# Patient Record
Sex: Male | Born: 1990 | Race: Black or African American | Hispanic: No | Marital: Single | Smoking: Never smoker
Health system: Southern US, Community
[De-identification: ages and names within clinical notes are randomized; demographics above are authoritative.]

---

## 2011-12-31 ENCOUNTER — Ambulatory Visit (INDEPENDENT_AMBULATORY_CARE_PROVIDER_SITE_OTHER): Payer: 59 | Admitting: Family Medicine

## 2011-12-31 VITALS — BP 124/77 | HR 81 | Temp 98.4°F | Resp 16 | Ht 67.25 in | Wt 185.2 lb

## 2011-12-31 DIAGNOSIS — Z0289 Encounter for other administrative examinations: Secondary | ICD-10-CM

## 2011-12-31 NOTE — Progress Notes (Signed)
  Subjective:    Patient ID: Bryan Williamson, male    DOB: [birth year], 21 y.o.   MRN: [id number]  HPI 21 yo male here for sports physical. Will be doing soccer and basketball at [employer].  Has form but did not bring it.  Using [employer] [employer] form for now.  He will bring his back in and we can fill it out if needed.  Screening questionnaire negative except sickle cell trait. Otherwise no concerns.   Review of Systems Negative except as per HPI     Objective:   Physical Exam  Constitutional: Vital signs are normal. He appears well-developed and well-nourished. He is active.  HENT:  Head: Normocephalic.  Right Ear: Hearing, tympanic membrane, external ear and ear canal normal.  Left Ear: Hearing, tympanic membrane, external ear and ear canal normal.  Nose: Nose normal.  Mouth/Throat: Uvula is midline, oropharynx is clear and moist and mucous membranes are normal.  Eyes: Conjunctivae and EOM are normal. Pupils are equal, round, and reactive to light.  Neck: No mass and no thyromegaly present.  Cardiovascular: Normal rate, regular rhythm, normal heart sounds, intact distal pulses and normal pulses.   Pulmonary/Chest: Effort normal and breath sounds normal.  Abdominal: Soft. Normal appearance and bowel sounds are normal. There is no hepatosplenomegaly. There is no tenderness. There is no CVA tenderness. No hernia. Hernia confirmed negative in the right inguinal area and confirmed negative in the left inguinal area.  Genitourinary: Testes normal and penis normal.  Lymphadenopathy:    He has no cervical adenopathy.    He has no axillary adenopathy.  Neurological: He is alert.          Assessment & Plan:  Normal exam.  Okay to play.

## 2012-01-01 ENCOUNTER — Telehealth: Payer: Self-pay

## 2012-01-01 NOTE — Telephone Encounter (Signed)
Dr. Georgiana Shore performed CPE.  Will forward note to her.

## 2012-01-01 NOTE — Telephone Encounter (Signed)
PT HAD A PE DONE BY DR.RICHTER YESTERDAY BUT NEEDS A FORM FILLED OUT FOR [employer] BASED ON THAT PE. PLEASE CALL WHEN READY: [phone] FORM IN [name]'S BOX

## 2012-01-01 NOTE — Telephone Encounter (Signed)
I am out of town until the 29th.  It was a normal exam.  Please complete the form based on my documented exam.

## 2012-01-02 NOTE — Telephone Encounter (Signed)
Called pt advised forms ready to pick up

## 2012-02-14 ENCOUNTER — Ambulatory Visit (INDEPENDENT_AMBULATORY_CARE_PROVIDER_SITE_OTHER): Payer: 59 | Admitting: Family Medicine

## 2012-02-14 ENCOUNTER — Ambulatory Visit: Payer: 59

## 2012-02-14 VITALS — BP 128/85 | HR 59 | Temp 98.0°F | Resp 16 | Ht 67.0 in | Wt 186.8 lb

## 2012-02-14 DIAGNOSIS — R109 Unspecified abdominal pain: Secondary | ICD-10-CM

## 2012-02-14 DIAGNOSIS — K5900 Constipation, unspecified: Secondary | ICD-10-CM

## 2012-02-14 DIAGNOSIS — R1084 Generalized abdominal pain: Secondary | ICD-10-CM

## 2012-02-14 LAB — POCT CBC
Granulocyte percent: 28.5 %G — AB (ref 37–80)
HCT, POC: 48 % (ref 43.5–53.7)
Hemoglobin: 16.5 g/dL (ref 14.1–18.1)
Lymph, poc: 2.8 (ref 0.6–3.4)
MCH, POC: 29.7 pg (ref 27–31.2)
MCHC: 34.4 g/dL (ref 31.8–35.4)
MCV: 86.5 fL (ref 80–97)
MID (cbc): 0.3 (ref 0–0.9)
MPV: 8.9 fL (ref 0–99.8)
POC Granulocyte: 1.3 — AB (ref 2–6.9)
POC LYMPH PERCENT: 64.2 %L — AB (ref 10–50)
POC MID %: 7.3 %M (ref 0–12)
Platelet Count, POC: 276 10*3/uL (ref 142–424)
RBC: 5.55 M/uL (ref 4.69–6.13)
RDW, POC: 14.3 %
WBC: 4.4 10*3/uL — AB (ref 4.6–10.2)

## 2012-02-14 LAB — POCT URINALYSIS DIPSTICK
Bilirubin, UA: NEGATIVE
Glucose, UA: NEGATIVE
Ketones, UA: NEGATIVE
Leukocytes, UA: NEGATIVE
Nitrite, UA: NEGATIVE
Protein, UA: NEGATIVE
Spec Grav, UA: 1.02
Urobilinogen, UA: 0.2
pH, UA: 7.5

## 2012-02-14 LAB — POCT UA - MICROSCOPIC ONLY
Casts, Ur, LPF, POC: NEGATIVE
Crystals, Ur, HPF, POC: NEGATIVE
Epithelial cells, urine per micros: NEGATIVE
Mucus, UA: POSITIVE
Yeast, UA: NEGATIVE

## 2012-02-14 MED ORDER — POLYETHYLENE GLYCOL 3350 17 GM/SCOOP PO POWD: 17.0000 g | Freq: Two times a day (BID) | ORAL | Status: AC | PRN

## 2012-02-14 NOTE — Progress Notes (Signed)
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 He has one week of right upper quadrant pain which is intermittent and worsened by eating. The pain starts in the right quadrant and radiates around the back. He's had no nausea vomiting, no fever, no urinary symptoms. He's had no past medical history of abdominal pain like this.  Family history is negative for diabetes, hypertension, abdominal problems  Patient has noted constipation lately and thinks that this pain may be related to that.  Objective: No acute distress HEENT: Mild gingivitis otherwise negative Neck: Supple no adenopathy Chest: Clear Heart: Regular, no murmur or gallop Abdomen: Soft without guarding or rebound. He does have some mild tenderness and fullness in the right lower quadrant. Skin: No rash Extremities: Well developed, muscular man with no rashes, good pedal pulses [facility] reading (PRIMARY) by  Dr. Milus Glazier. Large amount of stool in rectal ampulla  Results for orders placed in visit on 02/14/12  POCT CBC      Component Value Range   WBC 4.4 (*) 4.6 - 10.2 (K/uL)   Lymph, poc 2.8  0.6 - 3.4    POC LYMPH PERCENT 64.2 (*) 10 - 50 (%L)   MID (cbc) 0.3  0 - 0.9    POC MID % 7.3  0 - 12 (%M)   POC Granulocyte 1.3 (*) 2 - 6.9    Granulocyte percent 28.5 (*) 37 - 80 (%G)   RBC 5.55  4.69 - 6.13 (M/uL)   Hemoglobin 16.5  14.1 - 18.1 (g/dL)   HCT, POC 16.1  09.6 - 53.7 (%)   MCV 86.5  80 - 97 (fL)   MCH, POC 29.7  27 - 31.2 (pg)   MCHC 34.4  31.8 - 35.4 (g/dL)   RDW, POC 04.5     Platelet Count, POC 276  142 - 424 (K/uL)   MPV 8.9  0 - 99.8 (fL)  POCT URINALYSIS DIPSTICK      Component Value Range   Color, UA yellow     Clarity, UA clear     Glucose, UA neg     Bilirubin, UA neg     Ketones, UA neg     Spec Grav, UA 1.020     Blood, UA trace     pH, UA 7.5     Protein, UA neg     Urobilinogen, UA 0.2     Nitrite,  UA neg     Leukocytes, UA Negative    POCT UA - MICROSCOPIC ONLY      Component Value Range   WBC, Ur, HPF, POC 0-1     RBC, urine, microscopic 0-1     Bacteria, U Microscopic trace     Mucus, UA pos     Epithelial cells, urine per micros neg     Crystals, Ur, HPF, POC neg     Casts, Ur, LPF, POC neg     Yeast, UA neg       Assessment:  Constipation  Plan:  Miralax bid x 5 days

## 2012-02-14 NOTE — Patient Instructions (Signed)

## 2012-03-06 ENCOUNTER — Ambulatory Visit (INDEPENDENT_AMBULATORY_CARE_PROVIDER_SITE_OTHER): Payer: 59 | Admitting: Family Medicine

## 2012-03-06 VITALS — BP 111/72 | HR 71 | Temp 98.3°F | Resp 16 | Ht 67.0 in | Wt 181.0 lb

## 2012-03-06 DIAGNOSIS — K5909 Other constipation: Secondary | ICD-10-CM

## 2012-03-06 DIAGNOSIS — K5900 Constipation, unspecified: Secondary | ICD-10-CM

## 2012-03-06 NOTE — Progress Notes (Signed)
  Subjective:    Patient ID: Bryan Williamson, male    DOB: 06/29/1991, 21 y.o.   MRN: [id number]  HPI Pt here for persistence of constipation.  Pt was seen earlier in the month for this.  Was placed on miralax.  This helped.  Stopped using last week.  Constipation has recurred.  Now with BMs QOD. Very strenuous.  Mild lower abdominal pain.  Is heating high amount of red meat and fast food.  Review of Systems See HPI otherwise ROS negative.     Objective:   Physical Exam Gen: up in chair, NAD HEENT: NCAT, EOMI, TMs clear bilaterally CV: RRR, no murmurs auscultated PULM: CTAB, no wheezes, rales, rhoncii ABD: S/+ bowel sounds/mild lower abdominal pain  EXT: 2+ peripheral pulses   Assessment & Plan:  Will restart on miralax.  Discussed high fiber diet and decreased red meat intake.  Handout given. Discussed GI red flags for reevaluation including worsening abdominal pain.     The patient and/or caregiver has been counseled thoroughly with regard to treatment plan and/or medications prescribed including dosage, schedule, interactions, rationale for use, and possible side effects and they verbalize understanding. Diagnoses and expected course of recovery discussed and will return if not improved as expected or if the condition worsens. Patient and/or caregiver verbalized understanding.

## 2012-03-06 NOTE — Patient Instructions (Signed)
High Fiber Diet A high fiber diet changes your normal diet to include more whole grains, legumes, fruits, and vegetables. Changes in the diet involve replacing refined carbohydrates with unrefined foods. The calorie level of the diet is essentially unchanged. The Dietary Reference Intake (recommended amount) for adult males is 38 g per day. For adult females, it is 25 g per day. Pregnant and lactating women should consume 28 g of fiber per day. Fiber is the intact part of a plant that is not broken down during digestion. Functional fiber is fiber that has been isolated from the plant to provide a beneficial effect in the body. PURPOSE  Increase stool bulk.   Ease and regulate bowel movements.   Lower cholesterol.  INDICATIONS THAT YOU NEED MORE FIBER  Constipation and hemorrhoids.   Uncomplicated diverticulosis (intestine condition) and irritable bowel syndrome.   Weight management.   As a protective measure against hardening of the arteries (atherosclerosis), diabetes, and cancer.  NOTE OF CAUTION If you have a digestive or bowel problem, ask your caregiver for advice before adding high fiber foods to your diet. Some of the following medical problems are such that a high fiber diet should not be used without consulting your caregiver:  Acute diverticulitis (intestine infection).   Partial small bowel obstructions.   Complicated diverticular disease involving bleeding, rupture (perforation), or abscess (boil, furuncle).   Presence of autonomic neuropathy (nerve damage) or gastric paresis (stomach cannot empty itself).  GUIDELINES FOR INCREASING FIBER  Start adding fiber to the diet slowly. A gradual increase of about 5 more grams (2 slices of whole-wheat bread, 2 servings of most fruits or vegetables, or 1 bowl of high fiber cereal) per day is best. Too rapid an increase in fiber may result in constipation, flatulence, and bloating.   Drink enough water and fluids to keep your urine  clear or pale yellow. Water, juice, or caffeine-free drinks are recommended. Not drinking enough fluid may cause constipation.   Eat a variety of high fiber foods rather than one type of fiber.   Try to increase your intake of fiber through using high fiber foods rather than fiber pills or supplements that contain small amounts of fiber.   The goal is to change the types of food eaten. Do not supplement your present diet with high fiber foods, but replace foods in your present diet.  INCLUDE A VARIETY OF FIBER SOURCES  Replace refined and processed grains with whole grains, canned fruits with fresh fruits, and incorporate other fiber sources. White rice, white breads, and most bakery goods contain little or no fiber.   Brown whole-grain rice, buckwheat oats, and many fruits and vegetables are all good sources of fiber. These include: broccoli, Brussels sprouts, cabbage, cauliflower, beets, sweet potatoes, white potatoes (skin on), carrots, tomatoes, eggplant, squash, berries, fresh fruits, and dried fruits.   Cereals appear to be the richest source of fiber. Cereal fiber is found in whole grains and bran. Bran is the fiber-rich outer coat of cereal grain, which is largely removed in refining. In whole-grain cereals, the bran remains. In breakfast cereals, the largest amount of fiber is found in those with "bran" in their names. The fiber content is sometimes indicated on the label.   You may need to include additional fruits and vegetables each day.   In baking, for 1 cup white flour, you may use the following substitutions:   1 cup whole-wheat flour minus 2 tbs.    cup white flour plus    cup whole-wheat flour.  Document Released: 08/31/2005 Document Revised: 08/20/2011 Document Reviewed: 07/09/2009 Exit[employer] Patient Information 2012 [employer]. 

## 2013-09-12 ENCOUNTER — Emergency Department (HOSPITAL_COMMUNITY)
Admission: EM | Admit: 2013-09-12 | Discharge: 2013-09-12 | Disposition: A | Discharge status: Home / Self Care | Payer: 59 | Attending: Emergency Medicine | Admitting: Emergency Medicine

## 2013-09-12 ENCOUNTER — Encounter (HOSPITAL_COMMUNITY): Payer: Self-pay | Admitting: Emergency Medicine

## 2013-09-12 DIAGNOSIS — Z113 Encounter for screening for infections with a predominantly sexual mode of transmission: Secondary | ICD-10-CM | POA: Insufficient documentation

## 2013-09-12 MED ORDER — CEFTRIAXONE SODIUM 250 MG IJ SOLR
250.0000 mg | Freq: Once | INTRAMUSCULAR | Status: AC
  Administered 2013-09-12: 250 mg via INTRAMUSCULAR
  Filled 2013-09-12: qty 250

## 2013-09-12 MED ORDER — AZITHROMYCIN 250 MG PO TABS
1000.0000 mg | ORAL_TABLET | Freq: Once | ORAL | Status: AC
  Administered 2013-09-12: 1000 mg via ORAL
  Filled 2013-09-12: qty 4

## 2013-09-12 MED ORDER — LIDOCAINE HCL 1 % IJ SOLN
INTRAMUSCULAR | Status: AC
  Filled 2013-09-12: qty 20

## 2013-09-12 NOTE — ED Notes (Addendum)
Pt reports that during intercourse pt notice blood in condom. Pt went to bathroom and noticed blood draining from penis. Pt denies any pain or other discharge from penis.

## 2013-09-13 ENCOUNTER — Encounter (HOSPITAL_COMMUNITY): Payer: Self-pay | Admitting: Emergency Medicine

## 2013-09-13 ENCOUNTER — Emergency Department (HOSPITAL_COMMUNITY)
Admission: EM | Admit: 2013-09-13 | Discharge: 2013-09-13 | Disposition: A | Discharge status: Home / Self Care | Payer: 59 | Attending: Emergency Medicine | Admitting: Emergency Medicine

## 2013-09-13 DIAGNOSIS — Z79899 Other long term (current) drug therapy: Secondary | ICD-10-CM | POA: Insufficient documentation

## 2013-09-13 DIAGNOSIS — R109 Unspecified abdominal pain: Secondary | ICD-10-CM | POA: Insufficient documentation

## 2013-09-13 DIAGNOSIS — N4889 Other specified disorders of penis: Secondary | ICD-10-CM

## 2013-09-13 DIAGNOSIS — N501 Vascular disorders of male genital organs: Secondary | ICD-10-CM | POA: Insufficient documentation

## 2013-09-13 LAB — URINALYSIS, ROUTINE W REFLEX MICROSCOPIC
Glucose, UA: NEGATIVE mg/dL
Ketones, ur: >80 mg/dL — AB
Nitrite: NEGATIVE
pH: 6 (ref 5.0–8.0)

## 2013-09-13 LAB — URINE MICROSCOPIC-ADD ON

## 2013-09-13 NOTE — ED Provider Notes (Signed)
CSN: [id number]     Arrival date & time 09/13/13  1233 History   First MD Initiated Contact with Patient 09/13/13 1503     Chief Complaint  Patient presents with  . Penile bleeding    . Abdominal Pain   (Consider location/radiation/quality/duration/timing/severity/associated sxs/prior Treatment) HPI Comments: Patient presents with a chief complaint of penile bleeding.  He reports that he first noticed the bleeding two days ago during intercourse.  He was then evaluated in the ED at that time.  He had GC/Chlamydia testing performed, which came back negative.  He reports that last evening he again noticed bleeding when he squeezed his penis after urinating.  He reports that he noticed that the bleeding was coming from a small spot on the foreskin.  He is uncircumcised.  He states that earlier today he had some right sided abdominal pain, but he switched positions and the pain resolved.  He denies any abdominal pain at this time.  He reports that he took two aspirin and some water.  He then spit up a small amount of water.  He denies any penile lesions, penile bleeding, scrotal pain/swelling, dysuria, fever, or chills.  Denies nausea or diarrhea.    Patient is a 22 y.o. male presenting with abdominal pain. The history is provided by the patient.  Abdominal Pain   History reviewed. No pertinent past medical history. History reviewed. No pertinent past surgical history. History reviewed. No pertinent family history. History  Substance Use Topics  . Smoking status: Never Smoker   . Smokeless tobacco: Not on file  . Alcohol Use: No    Review of Systems  Gastrointestinal: Positive for abdominal pain.  All other systems reviewed and are negative.    Allergies  Review of patient's allergies indicates no known allergies.  Home Medications   Current Outpatient Rx  Name  Route  Sig  Dispense  Refill  . terbinafine (LAMISIL) 250 MG tablet   Oral   Take 250 mg by mouth daily.           BP 140/85  Pulse 93  Temp(Src) 98.2 F (36.8 C) (Oral)  Resp 20  SpO2 99% Physical Exam  Nursing note and vitals reviewed. Constitutional: He appears well-developed and well-nourished.  HENT:  Head: Normocephalic and atraumatic.  Mouth/Throat: Oropharynx is clear and moist.  Neck: Normal range of motion. Neck supple.  Cardiovascular: Normal rate, regular rhythm and normal heart sounds.   Pulmonary/Chest: Effort normal and breath sounds normal.  Abdominal: Soft. Bowel sounds are normal. He exhibits no distension and no mass. There is no tenderness. There is no rebound and no guarding.  Genitourinary: Testes normal.    Right testis shows no mass, no swelling and no tenderness. Left testis shows no mass, no swelling and no tenderness. Uncircumcised. No penile erythema. No discharge found.  Musculoskeletal: Normal range of motion.  Lymphadenopathy:       Right: No inguinal adenopathy present.       Left: No inguinal adenopathy present.  Neurological: He is alert.  Skin: Skin is warm and dry.  Psychiatric: He has a normal mood and affect.    ED Course  Procedures (including critical care time) Labs Review Labs Reviewed  URINALYSIS, ROUTINE W REFLEX MICROSCOPIC   Imaging Review No results found.  EKG Interpretation   None       MDM  No diagnosis found. Patient presenting with a chief complaint of bleeding from a very small scabbed area on the glans penis, which  could be a result of trauma during intercourse.  No active bleeding.  Area does not appear herpetic in appearance.  No lesion present.  GC/Chlamydia were done yesterday in the ED and were negative.  Patient without abdominal pain on exam.  No scrotal pain or swelling.  Feel that the patient is stable for discharge at this time.  Return precautions given.    Santiago Glad, PA-C 09/13/13 [phone]

## 2013-09-13 NOTE — ED Provider Notes (Signed)
CSN: [id number]     Arrival date & time 09/12/13  0130 History   First MD Initiated Contact with Patient 09/12/13 [phone]     Chief Complaint  Patient presents with  . Penile Discharge   (Consider location/radiation/quality/duration/timing/severity/associated sxs/prior Treatment) HPI History provided by patient. History of STD in the past. Is sexually active with multiple partners. Tonight noticed some blood after intercourse. No testicle pain. No GU lesions. No fevers or chills. No abdominal pain. No flank pain. Symptoms moderate severity.  History reviewed. No pertinent past medical history. History reviewed. No pertinent past surgical history. History reviewed. No pertinent family history. History  Substance Use Topics  . Smoking status: Never Smoker   . Smokeless tobacco: Not on file  . Alcohol Use: No    Review of Systems  Constitutional: Negative for fever and chills.  Gastrointestinal: Negative for abdominal pain.  Genitourinary: Negative for dysuria, penile swelling, scrotal swelling, penile pain and testicular pain.  Musculoskeletal: Negative for back pain.  Skin: Negative for rash.  Neurological: Negative for headaches.  All other systems reviewed and are negative.    Allergies  Review of patient's allergies indicates no known allergies.  Home Medications   Current Outpatient Rx  Name  Route  Sig  Dispense  Refill  . terbinafine (LAMISIL) 250 MG tablet   Oral   Take 250 mg by mouth daily.          BP 137/80  Pulse 63  Temp(Src) 98.6 F (37 C) (Oral)  Resp 16  Ht 5\' 7"  (1.702 m)  Wt 210 lb (95.255 kg)  BMI 32.88 kg/m2  SpO2 100% Physical Exam  Constitutional: He is oriented to person, place, and time. He appears well-developed and well-nourished.  HENT:  Head: Normocephalic and atraumatic.  Eyes: EOM are normal. Pupils are equal, round, and reactive to light.  Neck: Neck supple.  Cardiovascular: Regular rhythm and intact distal pulses.    Pulmonary/Chest: Effort normal. No respiratory distress.  Genitourinary:  Uncirc, no GU lesions. No testicle tenderness  Musculoskeletal: Normal range of motion. He exhibits no edema.  Neurological: He is alert and oriented to person, place, and time.  Skin: Skin is warm and dry. No rash noted.    ED Course  Procedures (including critical care time) Labs Review Labs Reviewed  GC/CHLAMYDIA PROBE AMP   Treated with Rocephin and azithromycin. Plan followup health department 48 hours for GC chlamydia results.   STD precautions provided. Patient agrees to discharge and followup instructions. MDM   1. Screen for STD (sexually transmitted disease)    Medications provided. vital signs and nursing notes reviewed and considered    Sunnie Nielsen, MD 09/13/13 [phone]

## 2013-09-13 NOTE — ED Notes (Signed)
Pt c/o intermittent penile bleeding starting yesterday and R side abdominal pain starting this morning.  Pain score 5/10.  Pt sts "there is bump on the side of my penis and it keeps bleeding."  Pt was seen at [facility] yesterday for same and tested/treated for STDS.  Pt is concerned, because it continues to bleed intermittently.  Pt sts "I took some aspirin for stomach, but I threw it up."

## 2013-09-13 NOTE — ED Provider Notes (Signed)
Medical screening examination/treatment/procedure(s) were performed by non-physician practitioner and as supervising physician I was immediately available for consultation/collaboration.  EKG Interpretation   None         Rolan Bucco, MD 09/13/13 1925

## 2013-09-14 LAB — URINE CULTURE
Colony Count: NO GROWTH
Culture: NO GROWTH

## 2014-02-07 ENCOUNTER — Other Ambulatory Visit (HOSPITAL_COMMUNITY)
Admission: RE | Admit: 2014-02-07 | Discharge: 2014-02-07 | Disposition: A | Discharge status: Home / Self Care | Payer: 59 | Source: Ambulatory Visit | Attending: Family Medicine | Admitting: Family Medicine

## 2014-02-07 DIAGNOSIS — Z113 Encounter for screening for infections with a predominantly sexual mode of transmission: Secondary | ICD-10-CM | POA: Insufficient documentation

## 2015-06-06 ENCOUNTER — Other Ambulatory Visit (HOSPITAL_COMMUNITY)
Admission: RE | Admit: 2015-06-06 | Discharge: 2015-06-06 | Disposition: A | Discharge status: Home / Self Care | Payer: 59 | Source: Ambulatory Visit | Attending: Family Medicine | Admitting: Family Medicine

## 2015-06-06 DIAGNOSIS — Z113 Encounter for screening for infections with a predominantly sexual mode of transmission: Secondary | ICD-10-CM | POA: Diagnosis not present

## 2015-11-21 ENCOUNTER — Other Ambulatory Visit (HOSPITAL_COMMUNITY)
Admission: RE | Admit: 2015-11-21 | Discharge: 2015-11-21 | Disposition: A | Discharge status: Home / Self Care | Payer: 59 | Source: Ambulatory Visit | Attending: Family Medicine | Admitting: Family Medicine

## 2015-11-21 DIAGNOSIS — Z113 Encounter for screening for infections with a predominantly sexual mode of transmission: Secondary | ICD-10-CM | POA: Insufficient documentation

## 2016-01-03 ENCOUNTER — Other Ambulatory Visit: Payer: Self-pay | Admitting: Family Medicine

## 2016-01-03 DIAGNOSIS — K5909 Other constipation: Secondary | ICD-10-CM

## 2016-01-03 DIAGNOSIS — K828 Other specified diseases of gallbladder: Secondary | ICD-10-CM

## 2016-01-10 ENCOUNTER — Other Ambulatory Visit: Payer: 59

## 2016-03-26 ENCOUNTER — Encounter (HOSPITAL_COMMUNITY): Payer: Self-pay | Admitting: Emergency Medicine

## 2016-03-26 ENCOUNTER — Emergency Department (HOSPITAL_COMMUNITY)
Admission: EM | Admit: 2016-03-26 | Discharge: 2016-03-26 | Disposition: A | Discharge status: Home / Self Care | Payer: 59 | Attending: Emergency Medicine | Admitting: Emergency Medicine

## 2016-03-26 DIAGNOSIS — F5102 Adjustment insomnia: Secondary | ICD-10-CM | POA: Insufficient documentation

## 2016-03-26 DIAGNOSIS — R1011 Right upper quadrant pain: Secondary | ICD-10-CM | POA: Insufficient documentation

## 2016-03-26 DIAGNOSIS — Z791 Long term (current) use of non-steroidal anti-inflammatories (NSAID): Secondary | ICD-10-CM | POA: Diagnosis not present

## 2016-03-26 DIAGNOSIS — R1012 Left upper quadrant pain: Secondary | ICD-10-CM | POA: Diagnosis not present

## 2016-03-26 DIAGNOSIS — Z79899 Other long term (current) drug therapy: Secondary | ICD-10-CM | POA: Insufficient documentation

## 2016-03-26 DIAGNOSIS — R1010 Upper abdominal pain, unspecified: Secondary | ICD-10-CM

## 2016-03-26 LAB — COMPREHENSIVE METABOLIC PANEL
ALK PHOS: 103 U/L (ref 38–126)
ALT: 27 U/L (ref 17–63)
AST: 34 U/L (ref 15–41)
Albumin: 4.6 g/dL (ref 3.5–5.0)
Anion gap: 8 (ref 5–15)
BILIRUBIN TOTAL: 1.2 mg/dL (ref 0.3–1.2)
BUN: 20 mg/dL (ref 6–20)
CALCIUM: 9.6 mg/dL (ref 8.9–10.3)
CO2: 23 mmol/L (ref 22–32)
Chloride: 107 mmol/L (ref 101–111)
Creatinine, Ser: 1.01 mg/dL (ref 0.61–1.24)
GFR calc Af Amer: >60 mL/min (ref >60)
GFR calc non Af Amer: >60 mL/min (ref >60)
Glucose, Bld: 114 mg/dL — ABNORMAL HIGH (ref 65–99)
Potassium: 4.1 mmol/L (ref 3.5–5.1)
Sodium: 138 mmol/L (ref 135–145)
TOTAL PROTEIN: 8.2 g/dL — AB (ref 6.5–8.1)

## 2016-03-26 LAB — CBC
HCT: 39.8 % (ref 39.0–52.0)
Hemoglobin: 14.7 g/dL (ref 13.0–17.0)
MCH: 29.9 pg (ref 26.0–34.0)
MCHC: 36.9 g/dL — AB (ref 30.0–36.0)
MCV: 80.9 fL (ref 78.0–100.0)
Platelets: 250 10*3/uL (ref 150–400)
RBC: 4.92 MIL/uL (ref 4.22–5.81)
RDW: 13.7 % (ref 11.5–15.5)
WBC: 6.8 10*3/uL (ref 4.0–10.5)

## 2016-03-26 LAB — URINALYSIS, ROUTINE W REFLEX MICROSCOPIC
BILIRUBIN URINE: NEGATIVE
Glucose, UA: NEGATIVE mg/dL
Hgb urine dipstick: NEGATIVE
Ketones, ur: 40 mg/dL — AB
Leukocytes, UA: NEGATIVE
NITRITE: NEGATIVE
PH: 6 (ref 5.0–8.0)
Protein, ur: NEGATIVE mg/dL
Specific Gravity, Urine: 1.038 — ABNORMAL HIGH (ref 1.005–1.030)

## 2016-03-26 LAB — LIPASE, BLOOD: Lipase: 25 U/L (ref 11–51)

## 2016-03-26 MED ORDER — OMEPRAZOLE 20 MG PO CPDR: 20.0000 mg | DELAYED_RELEASE_CAPSULE | Freq: Every day | ORAL | Status: DC

## 2016-03-26 NOTE — ED Provider Notes (Signed)
CSN: [id number]     Arrival date & time 03/26/16  1818 History   First MD Initiated Contact with Patient 03/26/16 2111     Chief Complaint  Patient presents with  . Abdominal Pain  . Insomnia    HPI   Bryan Williamson is an 25 y.o. male with no significant PMH who presents to the ED for evaluation of abdominal pain. He states for the past 1-2 weeks he has had bilateral upper abdominal pain intermittently, particularly after eating. Sometimes he feels it in his RUQ and sometimes in his LUQ. The pain episodes last for a few seconds and resolve on their own. He states he has been told it is acid reflux before. He has tried Scientist, research (medical)TUMS with no relief. Denies fever, chills, N/V/D, urinary symptoms. Denies pain currently.  Pt also complaining of sleep difficulty for the past week. He states he is able to fall asleep fine but will wake up in the middle of the night "out of the blue." He states he then takes a few minutes to fall back asleep and then will sleep until the morning. He states this is new for him and he typically sleeps through the night. Denies headache, dizziness, numbness, weakness. 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   History reviewed. No pertinent past medical history. History reviewed. No pertinent past surgical history. No family history on file. Social History  Substance Use Topics  . Smoking status: Never Smoker   . Smokeless tobacco: None  . Alcohol Use: No    Review of Systems  All other systems reviewed and are negative.     Allergies  Review of patient's allergies indicates no known allergies.  Home Medications   Prior to Admission medications   Medication Sig Start Date End Date Taking? Authorizing Provider  dexamethasone (DECADRON) 4 MG tablet Take 4 mg by mouth daily as needed (inflammation).  03/05/16  Yes Historical Provider, MD  LINZESS 145 MCG CAPS capsule Take 145 mcg by mouth daily as needed  (constipation).  01/27/16  Yes Historical Provider, MD  naproxen (NAPROSYN) 500 MG tablet Take 500 mg by mouth 2 (two) times daily as needed for mild pain or moderate pain.  03/18/16  Yes Historical Provider, MD   BP 138/78 mmHg  Pulse 93  Temp(Src) 98 F (36.7 C) (Oral)  Resp 16  Ht 5\' 9"  (1.753 m)  Wt 93.441 kg  BMI 30.41 kg/m2  SpO2 99% Physical Exam  Constitutional: He is oriented to person, place, and time.  HENT:  Right Ear: External ear normal.  Left Ear: External ear normal.  Nose: Nose normal.  Mouth/Throat: Oropharynx is clear and moist. No oropharyngeal exudate.  Eyes: Conjunctivae and EOM are normal. Pupils are equal, round, and reactive to light.  Neck: Normal range of motion. Neck supple.  Cardiovascular: Normal rate, regular rhythm, normal heart sounds and intact distal pulses.   Pulmonary/Chest: Effort normal and breath sounds normal. No respiratory distress. He has no wheezes. He exhibits no tenderness.  Abdominal: Soft. Bowel sounds are normal. He exhibits no distension. There is no tenderness. There is no rebound and no guarding.  Musculoskeletal: He exhibits no edema.  Neurological: He is alert and oriented to person, place, and time. No cranial nerve deficit.  Moves all extremities freely Speech clear Steady gait  Skin: Skin is warm and dry.  Psychiatric: He has a normal mood and affect.  Nursing note and  vitals reviewed.   ED Course  Procedures (including critical care time) Labs Review Labs Reviewed  COMPREHENSIVE METABOLIC PANEL - Abnormal; Notable for the following:    Glucose, Bld 114 (*)    Total Protein 8.2 (*)    All other components within normal limits  CBC - Abnormal; Notable for the following:    MCHC 36.9 (*)    All other components within normal limits  URINALYSIS, ROUTINE W REFLEX MICROSCOPIC (NOT AT [facility]) - Abnormal; Notable for the following:    Specific Gravity, Urine 1.038 (*)    Ketones, ur 40 (*)    All other components within  normal limits  LIPASE, BLOOD    Imaging Review No results found. I have personally reviewed and evaluated these images and lab results as part of my medical decision-making.   EKG Interpretation None      MDM   Final diagnoses:  Pain of upper abdomen  Insomnia due to stress    CBC, lipase, and CMP unrevealing. UA with some ketones and increased specific gravity. Pt pain free in the ED with nonfocal exam. Will trial course of omeprazole to see if symptoms improve. Encouraged plenty of PO hydration. Sleep issues likely due to stress. No sleep onset insomnia and pt able to fall back asleep quickly after one episode of waking in the middle of the night. Encouraged talking to coaches and on-campus mental health counselors to help with stress load. Encouraged close PCP follow up. Referral to GI given as well. ER return precautions given.   Carlene Coria, PA-C 03/27/16 0113  Mancel Bale, MD 03/27/16 [phone]

## 2016-03-26 NOTE — Discharge Instructions (Signed)
You were seen in the emergency room today for evaluation of abdominal pain and insomnia. Regarding your abdominal pain, I will give you a prescription for Prilosec to see if it helps with your symptoms. Please call [facility] to schedule a follow up appointment. Your urine did show signs of dehydration today so remember to drink plenty of water (at least 64 oz daily). Regarding your sleep issues, your sleep troubles are likely due to stress. I will hold off on prescribing any sleep medicine for now. Please follow up with your primary care provider as soon as possible. Also talk to your coach or counselor at school if you feel you need more support. Return to the ER for new or worsening symptoms.

## 2016-03-26 NOTE — ED Notes (Signed)
Patient presents for RUQ abdominal pain x1 week, worsening after eating. Patient denies N/V/D. Last BM yesterday and normal for patient. Denies fever, urinary symptoms. Patient also states he's been waking up during the middle of the night and was wondering if that was normal.

## 2016-04-10 ENCOUNTER — Other Ambulatory Visit: Payer: Self-pay | Admitting: Family Medicine

## 2016-04-10 ENCOUNTER — Other Ambulatory Visit (HOSPITAL_COMMUNITY)
Admission: RE | Admit: 2016-04-10 | Discharge: 2016-04-10 | Disposition: A | Discharge status: Home / Self Care | Payer: 59 | Source: Ambulatory Visit | Attending: Family Medicine | Admitting: Family Medicine

## 2016-04-10 DIAGNOSIS — Z113 Encounter for screening for infections with a predominantly sexual mode of transmission: Secondary | ICD-10-CM | POA: Insufficient documentation

## 2016-04-15 LAB — URINE CYTOLOGY ANCILLARY ONLY
Bacterial vaginitis: NEGATIVE
Candida vaginitis: NEGATIVE
Chlamydia: NEGATIVE
Neisseria Gonorrhea: NEGATIVE
Trichomonas: NEGATIVE

## 2016-05-22 ENCOUNTER — Encounter: Payer: Self-pay | Admitting: Physician Assistant

## 2016-05-22 ENCOUNTER — Ambulatory Visit (INDEPENDENT_AMBULATORY_CARE_PROVIDER_SITE_OTHER): Payer: 59 | Admitting: Physician Assistant

## 2016-05-22 VITALS — BP 110/76 | HR 65 | Temp 97.9°F | Resp 16 | Ht 67.0 in | Wt 195.0 lb

## 2016-05-22 DIAGNOSIS — Z8719 Personal history of other diseases of the digestive system: Secondary | ICD-10-CM | POA: Diagnosis not present

## 2016-05-22 DIAGNOSIS — K219 Gastro-esophageal reflux disease without esophagitis: Secondary | ICD-10-CM | POA: Diagnosis not present

## 2016-05-22 DIAGNOSIS — Z87898 Personal history of other specified conditions: Secondary | ICD-10-CM

## 2016-05-22 LAB — POCT CBC
Granulocyte percent: 38.2 %G (ref 37–80)
HCT, POC: 42.1 % — AB (ref 43.5–53.7)
Hemoglobin: 15 g/dL (ref 14.1–18.1)
Lymph, poc: 2.5 (ref 0.6–3.4)
MCH, POC: 30.1 pg (ref 27–31.2)
MCHC: 35.6 g/dL — AB (ref 31.8–35.4)
MCV: 84.6 fL (ref 80–97)
MID (cbc): 0.2 (ref 0–0.9)
MPV: 7.1 fL (ref 0–99.8)
POC Granulocyte: 1.7 — AB (ref 2–6.9)
POC LYMPH %: 56.6 % — AB (ref 10–50)
POC MID %: 5.2 %M (ref 0–12)
Platelet Count, POC: 218 10*3/uL (ref 142–424)
RBC: 4.98 M/uL (ref 4.69–6.13)
RDW, POC: 13.9 %
WBC: 4.4 10*3/uL — AB (ref 4.6–10.2)

## 2016-05-22 LAB — COMPLETE METABOLIC PANEL WITH GFR
ALT: 12 U/L (ref 9–46)
AST: 20 U/L (ref 10–40)
Albumin: 4.4 g/dL (ref 3.6–5.1)
Alkaline Phosphatase: 85 U/L (ref 40–115)
BUN: 12 mg/dL (ref 7–25)
CO2: 25 mmol/L (ref 20–31)
CREATININE: 1.03 mg/dL (ref 0.60–1.35)
Calcium: 9.8 mg/dL (ref 8.6–10.3)
Chloride: 105 mmol/L (ref 98–110)
GFR, Est Non African American: >89 mL/min (ref >=60)
GLUCOSE: 95 mg/dL (ref 65–99)
Potassium: 4.2 mmol/L (ref 3.5–5.3)
Sodium: 138 mmol/L (ref 135–146)
Total Bilirubin: 1.4 mg/dL — ABNORMAL HIGH (ref 0.2–1.2)
Total Protein: 7.6 g/dL (ref 6.1–8.1)

## 2016-05-22 LAB — LIPASE: LIPASE: 19 U/L (ref 7–60)

## 2016-05-22 MED ORDER — OMEPRAZOLE 20 MG PO CPDR: 20.0000 mg | DELAYED_RELEASE_CAPSULE | Freq: Two times a day (BID) | ORAL | 1 refills | Status: DC

## 2016-05-22 NOTE — Patient Instructions (Addendum)
Take omeprazole twice daily for 4 weeks. Work on diet and work on not eating late at night right before bed.  If no improvement with diet/lifestyle modifications/and increase in PPI in four weeks we will refer to GI.  Follow up in 4 weeks, if no improvement   Food Choices for Gastroesophageal Reflux Disease, Adult When you have gastroesophageal reflux disease (GERD), the foods you eat and your eating habits are very important. Choosing the right foods can help ease your discomfort.  WHAT GUIDELINES DO I NEED TO FOLLOW?   Choose fruits, vegetables, whole grains, and low-fat dairy products.   Choose low-fat meat, fish, and poultry.  Limit fats such as oils, salad dressings, butter, nuts, and avocado.   Keep a food diary. This helps you identify foods that cause symptoms.   Avoid foods that cause symptoms. These may be different for everyone.   Eat small meals often instead of 3 large meals a day.   Eat your meals slowly, in a place where you are relaxed.   Limit fried foods.   Cook foods using methods other than frying.   Avoid drinking alcohol.   Avoid drinking large amounts of liquids with your meals.   Avoid bending over or lying down until 2-3 hours after eating.  WHAT FOODS ARE NOT RECOMMENDED?  These are some foods and drinks that may make your symptoms worse: Vegetables Tomatoes. Tomato juice. Tomato and spaghetti sauce. Chili peppers. Onion and garlic. Horseradish. Fruits Oranges, grapefruit, and lemon (fruit and juice). Meats High-fat meats, fish, and poultry. This includes hot dogs, ribs, ham, sausage, salami, and bacon. Dairy Whole milk and chocolate milk. Sour cream. Cream. Butter. Ice cream. Cream cheese.  Drinks Coffee and tea. Bubbly (carbonated) drinks or energy drinks. Condiments Hot sauce. Barbecue sauce.  Sweets/Desserts Chocolate and cocoa. Donuts. Peppermint and spearmint. Fats and Oils High-fat foods. This includes [place] fries and  potato chips. Other Vinegar. Strong spices. This includes black pepper, white pepper, red pepper, cayenne, curry powder, cloves, ginger, and chili powder. The items listed above may not be a complete list of foods and drinks to avoid. Contact your dietitian for more information.   This information is not intended to replace advice given to you by your health care provider. Make sure you discuss any questions you have with your health care provider.   Document Released: 03/01/2012 Document Revised: 09/21/2014 Document Reviewed: 07/05/2013 Elsevier Interactive Patient Education 2016 [employer]   IF you received an x-ray today, you will receive an invoice from [facility]. Please contact [facility] at [phone] with questions or concerns regarding your invoice.   IF you received labwork today, you will receive an invoice from [facility]. Please contact [facility] at [phone] with questions or concerns regarding your invoice.   Our billing staff will not be able to assist you with questions regarding bills from these companies.  You will be contacted with the lab results as soon as they are available. The fastest way to get your results is to activate your My Chart account. Instructions are located on the last page of this paperwork. If you have not heard from us regarding the results in 2 weeks, please contact this office.

## 2016-05-22 NOTE — Progress Notes (Signed)
Bryan Williamson  MRN: [id number] DOB: 07/30/1991  Subjective:  Bryan DoctorBrandon Williamson is a 25 y.o. male seen in office today for a chief complaint of GERD like symptoms x 5 months. Pt states that since April 2017 when he does morning workouts (jogging and stretching) he feels nauseous and then has to burp to clear it. He has also been acid reflux, belching, and intermittent sharp epigastric pain that is relieved with food.   Pt went to the hospital in 03/2016 for the same issue and he was given prescription for omeprazole 20mg  daily. He took it every day for a month and had a little relief. He ran out of his prescription and he tired zantac for the past month with no relief. He has also tried apple cider vinegar with mild relief.   In terms of diet, pt has been eating a lot of fast food and notes that it makes the symptoms worse. He also often eats a big meal late at night due to his late class and notes that the nights he does this he wakes up feeling worse during his morning workouts.    The sharp epigastric pain comes about and it is relieved when he eats food then it will come back about 2 hours later. Pt uses tylenol every now and then but no excessive NSAID use. Denies smoking and alcohol use.   Review of Systems  Constitutional: Positive for fatigue. Negative for appetite change and chills.  HENT: Negative for sore throat.   Cardiovascular: Negative for chest pain and palpitations.  Gastrointestinal: Positive for diarrhea (intermittent). Negative for abdominal distention, blood in stool and vomiting.  Musculoskeletal: Negative for arthralgias.  Allergic/Immunologic: Negative for food allergies.    There are no active problems to display for this patient.   Current Outpatient Prescriptions on File Prior to Visit  Medication Sig Dispense Refill  . LINZESS 145 MCG CAPS capsule Take 145 mcg by mouth daily as needed (constipation).   2   No current facility-administered medications on file  prior to visit.     No Known Allergies  Social History   Social History  . Marital status: Single    Spouse name: N/A  . Number of children: N/A  . Years of education: N/A   Occupational History  . Not on file.   Social History Main Topics  . Smoking status: Never Smoker  . Smokeless tobacco: Never Used  . Alcohol use No  . Drug use: No  . Sexual activity: Yes    Birth control/ protection: Condom   Other Topics Concern  . Not on file   Social History Narrative  . No narrative on file    Objective:  BP 110/76 (BP Location: Left Arm, Patient Position: Sitting, Cuff Size: Normal)   Pulse 65   Temp 97.9 F (36.6 C) (Oral)   Resp 16   Ht 5\' 7"  (1.702 m)   Wt 195 lb (88.5 kg)   SpO2 98%   BMI 30.54 kg/m   Physical Exam  Constitutional: He is oriented to person, place, and time and well-developed, well-nourished, and in no distress.  HENT:  Head: Normocephalic and atraumatic.  Mouth/Throat: Uvula is midline, oropharynx is clear and moist and mucous membranes are normal.  Eyes: Conjunctivae are normal.  Neck: Normal range of motion.  Pulmonary/Chest: Effort normal and breath sounds normal.  Abdominal: Soft. Normal appearance and bowel sounds are normal. There is tenderness in the right lower quadrant. There is no rigidity, no rebound,  no guarding, no CVA tenderness, no tenderness at McBurney's point and negative Murphy's sign.  Lymphadenopathy:       Head (right side): No submental, no submandibular, no tonsillar, no preauricular, no posterior auricular and no occipital adenopathy present.       Head (left side): No submental, no submandibular, no tonsillar, no preauricular, no posterior auricular and no occipital adenopathy present.    He has cervical adenopathy.       Right cervical: Posterior cervical adenopathy present. No superficial cervical and no deep cervical adenopathy present.      Left cervical: No superficial cervical, no deep cervical and no posterior  cervical adenopathy present.       Right: No supraclavicular adenopathy present.       Left: No supraclavicular adenopathy present.  Neurological: He is alert and oriented to person, place, and time. Gait normal.  Skin: Skin is warm and dry.  Psychiatric: Affect normal.  Vitals reviewed.  Results for orders placed or performed in visit on 05/22/16 (from the past 24 hour(s))  POCT CBC     Status: Abnormal   Collection Time: 05/22/16  9:28 AM  Result Value Ref Range   WBC 4.4 (A) 4.6 - 10.2 K/uL   Lymph, poc 2.5 0.6 - 3.4   POC LYMPH PERCENT 56.6 (A) 10 - 50 %L   MID (cbc) 0.2 0 - 0.9   POC MID % 5.2 0 - 12 %M   POC Granulocyte 1.7 (A) 2 - 6.9   Granulocyte percent 38.2 37 - 80 %G   RBC 4.98 4.69 - 6.13 M/uL   Hemoglobin 15.0 14.1 - 18.1 g/dL   HCT, POC 16.1 (A) 09.6 - 53.7 %   MCV 84.6 80 - 97 fL   MCH, POC 30.1 27 - 31.2 pg   MCHC 35.6 (A) 31.8 - 35.4 g/dL   RDW, POC 04.5 %   Platelet Count, POC 218 142 - 424 K/uL   MPV 7.1 0 - 99.8 fL    Assessment and Plan :   1. History of epigastric pain - H. pylori breath test - POCT CBC - Lipase - COMPLETE METABOLIC PANEL WITH GFR  2. Gastroesophageal reflux disease, esophagitis presence not specified -Educated importance of dietary and lifestyle modifications - omeprazole (PRILOSEC) 20 MG capsule; Take 1 capsule (20 mg total) by mouth 2 (two) times daily before a meal.  Dispense: 60 capsule; Refill: 1 -If no improvement in four weeks with lifestyle/diet/increase in PPI, will refer to GI  Bryan Core PA-C  [facility] 05/22/2016 9:35 AM

## 2016-05-25 LAB — H. PYLORI BREATH TEST: H. pylori Breath Test: NOT DETECTED

## 2016-07-09 ENCOUNTER — Ambulatory Visit (INDEPENDENT_AMBULATORY_CARE_PROVIDER_SITE_OTHER): Payer: 59 | Admitting: Physician Assistant

## 2016-07-09 ENCOUNTER — Ambulatory Visit (INDEPENDENT_AMBULATORY_CARE_PROVIDER_SITE_OTHER): Payer: 59

## 2016-07-09 ENCOUNTER — Encounter: Payer: Self-pay | Admitting: Physician Assistant

## 2016-07-09 VITALS — BP 110/70 | HR 68 | Temp 98.0°F | Resp 16 | Ht 67.0 in | Wt 186.2 lb

## 2016-07-09 DIAGNOSIS — S6992XA Unspecified injury of left wrist, hand and finger(s), initial encounter: Secondary | ICD-10-CM | POA: Diagnosis not present

## 2016-07-09 DIAGNOSIS — R1013 Epigastric pain: Secondary | ICD-10-CM

## 2016-07-09 NOTE — Progress Notes (Signed)
Bryan DoctorBrandon Savannah  MRN: [id number] DOB: 04/20/1991  Subjective:  Bryan Williamson is a 25 y.o. male seen in office today for follow up on stomach issues. Pt initially seen on 05/22/2016 for GERD like symptoms x 5 months. His CBC, h.pylori, CMP, and lipase were unremarkable at this visit. He was given a PPI BID, encouraged to alter his eating habits, and told to follow up if no improvement.   Pt notes that he took omeprazole daily for 30 days but then did not pick up his next prescription. Instead he has been using zantac as needed. He has also changed his eating habits. He has decreased his fast food intake and has stopped eating right before bed. Notes the epigastric pain and acid reflux have gotten better and less frequent but the nausea and belching are still present often times in the morning when he wakes up for practice.   Pt also had a left hand injury today. Notes that at practice this morning he was trying to catch a football that someone threw to him but it hit his left pinky finger directly. He had immediate pain, swelling, bruising, and decreased ROM. It has been aching since this morning. He has not tried anything for pain.   Review of Systems  Constitutional: Negative for chills, diaphoresis, fatigue and fever.  Gastrointestinal: Negative for constipation, diarrhea and vomiting.  Genitourinary: Negative for difficulty urinating and dysuria.    There are no active problems to display for this patient.   Current Outpatient Prescriptions on File Prior to Visit  Medication Sig Dispense Refill  . omeprazole (PRILOSEC) 20 MG capsule Take 1 capsule (20 mg total) by mouth 2 (two) times daily before a meal. 60 capsule 1  . LINZESS 145 MCG CAPS capsule Take 145 mcg by mouth daily as needed (constipation).   2   No current facility-administered medications on file prior to visit.     No Known Allergies   Social History   Social History  . Marital status: Single    Spouse name: N/A  .  Number of children: N/A  . Years of education: N/A   Occupational History  . Not on file.   Social History Main Topics  . Smoking status: Never Smoker  . Smokeless tobacco: Never Used  . Alcohol use No  . Drug use: No  . Sexual activity: Yes    Birth control/ protection: Condom   Other Topics Concern  . Not on file   Social History Narrative  . No narrative on file   Pt has no family history of abdominal issues  Objective:  BP 110/70 (BP Location: Right Arm, Patient Position: Sitting, Cuff Size: Normal)   Pulse 68   Temp 98 F (36.7 C) (Oral)   Resp 16   Ht 5\' 7"  (1.702 m)   Wt 186 lb 3.2 oz (84.5 kg)   SpO2 99%   BMI 29.16 kg/m   Physical Exam  Constitutional: He is oriented to person, place, and time and well-developed, well-nourished, and in no distress.  HENT:  Head: Normocephalic and atraumatic.  Eyes: Conjunctivae are normal.  Neck: Normal range of motion.  Pulmonary/Chest: Effort normal.  Abdominal: Soft. Normal appearance and bowel sounds are normal. There is tenderness in the epigastric area.  Musculoskeletal:       Left wrist: He exhibits bony tenderness ( most distal aspect of ulna). He exhibits normal range of motion and no swelling.       Left hand: He exhibits bony  tenderness. He exhibits normal capillary refill. Normal strength noted.       Hands: Neurological: He is alert and oriented to person, place, and time. Gait normal.  Skin: Skin is warm and dry.  Psychiatric: Affect normal.  Vitals reviewed.  Dg Wrist Complete Left  Result Date: 07/09/2016 CLINICAL DATA:  Left hand injury.  Decreased range of motion. EXAM: LEFT WRIST - COMPLETE 3+ VIEW COMPARISON:  None. FINDINGS: There is no evidence of fracture or dislocation. There is no evidence of arthropathy or other focal bone abnormality. Soft tissues are unremarkable. IMPRESSION: Negative. Electronically Signed   By: Elige Ko   On: 07/09/2016 10:56   Dg Finger Little Left  Result Date:  07/09/2016 CLINICAL DATA:  Hand injury, initial encounter, fifth finger injury at football practice EXAM: LEFT LITTLE FINGER 2+V COMPARISON:  None. FINDINGS: Three views of the left fifth finger submitted. No acute fracture or subluxation. IMPRESSION: Negative. Electronically Signed   By: Natasha Mead M.D.   On: 07/09/2016 10:57    Assessment and Plan :   1. Hand injury, left, initial encounter -Likely sprain, P.R.I.C.E principles -5th digit buddy taped to 4th digit, wrist exercises given  - DG Wrist Complete Left; Future - DG Finger Little Left; Future -Return to clinic if symptoms worsen, do not improve, or as needed  2. Epigastric pain -Continue daily PPI - Ambulatory referral to Gastroenterology  Benjiman Core PA-C  [facility] 07/09/2016 11:07 AM

## 2016-07-09 NOTE — Patient Instructions (Addendum)
GI will call you with an appointment. Please pick up the PPI refill and take this in the meantime and continue healthy eating habits.  For your finger, you may use ice and tylenol for inflammation and pain. Keep buddy taped until pain and swelling resolve.   Return to clinic if symptoms worsen, do not improve, or as needed  Generic Wrist Exercises RANGE OF MOTION (ROM) AND STRETCHING EXERCISES - Wrist Sprain  These exercises may help you when beginning to rehabilitate your injury. Your symptoms may resolve with or without further involvement from your physician, physical therapist, or athletic trainer. While completing these exercises, remember:   Restoring tissue flexibility helps normal motion to return to the joints. This allows healthier, less painful movement and activity.  An effective stretch should be held for at least 30 seconds.  A stretch should never be painful. You should only feel a gentle lengthening or release in the stretched tissue. RANGE OF MOTION - Wrist Flexion, Active-Assisted  Extend your right / left elbow with your palm pointing down.*  Gently pull the back of your hand toward you until you feel a gentle stretch on the top of your forearm.  Hold this position for __________ seconds. Repeat __________ times. Complete this exercise __________ times per day.  *If directed by your physician, physical therapist, or athletic trainer, complete this stretch with your elbow bent rather than extended. RANGE OF MOTION - Wrist Extension, Active-Assisted   Extend your right / left elbow and turn your palm upward.*  Gently pull your palm/fingertips back so your wrist extends and your fingers point more toward the ground.  You should feel a gentle stretch on the inside of your forearm.  Hold this position for __________ seconds. Repeat __________ times. Complete this exercise __________ times per day. *If directed by your physician, physical therapist, or athletic  trainer, complete this stretch with your elbow bent, rather than extended. RANGE OF MOTION - Supination, Active   Stand or sit with your elbows at your side. Bend your right / left elbow to 90 degrees.  Turn your palm upward until you feel a gentle stretch on the inside of your forearm.  Hold this position for __________ seconds. Slowly release and return to the starting position. Repeat __________ times. Complete this stretch __________ times per day.  RANGE OF MOTION - Pronation, Active   Stand or sit with your elbows at your side. Bend your right / left elbow to 90 degrees.  Turn your palm downward until you feel a gentle stretch on the top of your forearm.  Hold this position for __________ seconds. Slowly release and return to the starting position. Repeat __________ times. Complete this stretch __________ times per day.  STRENGTHENING EXERCISES  These exercises may help you when beginning to rehabilitate your injury. They may resolve your symptoms with or without further involvement from your physician, physical therapist, or athletic trainer. While completing these exercises, remember:   Muscles can gain both the endurance and the strength needed for everyday activities through controlled exercises.  Complete these exercises as instructed by your physician, physical therapist, or athletic trainer. Progress the resistance and repetitions only as guided.  You may experience muscle soreness or fatigue, but the pain or discomfort you are trying to eliminate should never worsen during these exercises. If this pain does worsen, stop and make certain you are following the directions exactly. If the pain is still present after adjustments, discontinue the exercise until you can discuss the trouble  with your clinician. STRENGTH - Wrist Flexors  Sit with your right / left forearm palm-up and fully supported. Your elbow should be resting below the height of your shoulder. Allow your wrist to  extend over the edge of the surface.  Loosely holding a __________ weight or a piece of rubber exercise band/tubing, slowly curl your hand up toward your forearm.  Hold this position for __________ seconds. Slowly lower the wrist back to the starting position in a controlled manner. Repeat __________ times. Complete this exercise __________ times per day.  STRENGTH - Wrist Extensors  Sit with your right / left forearm palm-down and fully supported. Your elbow should be resting below the height of your shoulder. Allow your wrist to extend over the edge of the surface.  Loosely holding a __________ weight or a piece of rubber exercise band/tubing, slowly curl your hand up toward your forearm.  Hold this position for __________ seconds. Slowly lower the wrist back to the starting position in a controlled manner. Repeat __________ times. Complete this exercise __________ times per day.  STRENGTH - Forearm Supinators  Sit with your right / left forearm supported on a table, keeping your elbow below shoulder height. Rest your hand over the edge, palm down.  Gently grip a hammer or a soup ladle.  Without moving your elbow, slowly turn your palm and hand upward to a "thumbs-up" position.  Hold this position for __________ seconds. Slowly return to the starting position. Repeat __________ times. Complete this exercise __________ times per day.  STRENGTH - Forearm Pronators   Sit with your right / left forearm supported on a table, keeping your elbow below shoulder height. Rest your hand over the edge, palm up.  Gently grip a hammer or a soup ladle.  Without moving your elbow, slowly turn your palm and hand upward to a "thumbs-up" position.  Hold this position for __________ seconds. Slowly return to the starting position. Repeat __________ times. Complete this exercise __________ times per day.  STRENGTH - Grip  Grasp a tennis ball, a dense sponge, or a large, rolled sock in your  hand.  Squeeze as hard as you can without increasing any pain.  Hold this position for __________ seconds. Release your grip slowly. Repeat __________ times. Complete this exercise __________ times per day.    This information is not intended to replace advice given to you by your health care provider. Make sure you discuss any questions you have with your health care provider.   Document Released: 07/15/2005 Document Revised: 09/21/2014 Document Reviewed: 12/13/2008 Elsevier Interactive Patient Education 2016 [employer]   IF you received an x-ray today, you will receive an invoice from [facility]. Please contact [facility] at [phone] with questions or concerns regarding your invoice.   IF you received labwork today, you will receive an invoice from [facility]. Please contact [facility] at [phone] with questions or concerns regarding your invoice.   Our billing staff will not be able to assist you with questions regarding bills from these companies.  You will be contacted with the lab results as soon as they are available. The fastest way to get your results is to activate your My Chart account. Instructions are located on the last page of this paperwork. If you have not heard from us regarding the results in 2 weeks, please contact this office.

## 2016-07-10 ENCOUNTER — Encounter: Payer: Self-pay | Admitting: Physician Assistant

## 2016-07-29 ENCOUNTER — Ambulatory Visit (INDEPENDENT_AMBULATORY_CARE_PROVIDER_SITE_OTHER): Payer: 59 | Admitting: Physician Assistant

## 2016-07-29 ENCOUNTER — Encounter: Payer: Self-pay | Admitting: Physician Assistant

## 2016-07-29 VITALS — BP 114/80 | HR 68 | Ht 67.0 in | Wt 187.6 lb

## 2016-07-29 DIAGNOSIS — K5909 Other constipation: Secondary | ICD-10-CM

## 2016-07-29 DIAGNOSIS — K219 Gastro-esophageal reflux disease without esophagitis: Secondary | ICD-10-CM | POA: Diagnosis not present

## 2016-07-29 DIAGNOSIS — R1013 Epigastric pain: Secondary | ICD-10-CM

## 2016-07-29 DIAGNOSIS — R110 Nausea: Secondary | ICD-10-CM

## 2016-07-29 MED ORDER — LINZESS 145 MCG PO CAPS: 145.0000 ug | ORAL_CAPSULE | Freq: Every day | ORAL | 3 refills | Status: DC | PRN

## 2016-07-29 MED ORDER — OMEPRAZOLE 20 MG PO CPDR: DELAYED_RELEASE_CAPSULE | ORAL | 1 refills | Status: DC

## 2016-07-29 NOTE — Progress Notes (Signed)
Chief Complaint: Epigastric Pain, GERD, Constipation, Nausea  HPI:  Bryan Williamson is a 25 y/o AA male who was referred to me by Benjiman Core D, PA* for a complaint of Epigastric pain and nausea.   Today, the patient tells me that in May he started with an epigastric pain and nausea when he would awaken the morning. He also noticed a large amount of reflux when he would eat certain foods, typically greasy, spicy or fatty foods. Initially he was started on Omeprazole 20 mg daily which helped for a few months, but then stopped working around September. At that time it was increased to twice a day and continued to help for the next month or so, but the patient was then told by his primary care physician that this could "cause memory loss" so he stopped this medicine. Since stopping this medicine about a month ago he has had an increase in symptoms including nausea in the morning when he wakes up accompanied by a lot of eructations and reflux. The patient has altered his diet in order to decrease reflux symptoms which has helped some. He has also lost around 9 pounds due to his change in diet.   Patient does have chronic constipation which is helped with Linzess 145 mcg daily. He asked for a refill of this today.   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   Patient denies fever, chills, blood in the stool, melena, fatigue, anorexia, hoarseness, dysphasia or symptoms that awaken him at night.   Past medical history:  Constipation  Past Surgical History None   Current Outpatient Prescriptions  Medication Sig Dispense Refill  . LINZESS 145 MCG CAPS capsule Take 145 mcg by mouth daily as needed (constipation).   2  . omeprazole (PRILOSEC) 20 MG capsule Take 1 capsule (20 mg total) by mouth 2 (two) times daily before a meal. (Patient not taking: Reported on 07/29/2016) 60 capsule 1   No current facility-administered medications for this visit.      Allergies as of 07/29/2016  . (No Known Allergies)   Family History: No family history of liver or pancreatic disease or colon cancer or colon polyps   Social History   Social History  . Marital status: Single    Spouse name: N/A  . Number of children: N/A  . Years of education: N/A   Occupational History  . Not on file.   Social History Main Topics  . Smoking status: Never Smoker  . Smokeless tobacco: Never Used  . Alcohol use No  . Drug use: No  . Sexual activity: Yes    Birth control/ protection: Condom   Other Topics Concern  . Not on file   Social History Narrative  . No narrative on file    Review of Systems:     Constitutional: Positive for weight loss of 9 pounds No fever, chills, weakness or fatigue HEENT: Eyes: No change in vision               Ears, Nose, Throat:  No change in hearing or congestion Skin: No rash or itching Cardiovascular: No chest pain, chest pressure or palpitations   Respiratory: No SOB or cough Gastrointestinal: See HPI and otherwise negative Genitourinary: No dysuria or change in urinary frequency Neurological: No headache, dizziness or syncope Musculoskeletal: No new muscle or joint pain Hematologic: No bleeding or bruising Psychiatric: No history of depression or anxiety   Physical Exam:  Vital signs: BP  114/80 (BP Location: Left Arm, Patient Position: Sitting, Cuff Size: Normal)   Pulse 68   Ht 5\' 7"  (1.702 m)   Wt 187 lb 9.6 oz (85.1 kg)   BMI 29.38 kg/m   General:   Pleasant African-American male  appears to be in NAD, Well developed, Well nourished, alert and cooperative Head:  Normocephalic and atraumatic. Eyes:   PEERL, EOMI. No icterus. Conjunctiva pink. Ears:  Normal auditory acuity. Neck:  Supple Throat: Oral cavity and pharynx without inflammation, swelling or lesion. Teeth in good condition. Lungs: Respirations even and unlabored. Lungs clear to auscultation bilaterally.   No wheezes, crackles, or rhonchi.   Heart: Normal S1, S2. No MRG. Regular rate and rhythm. No peripheral edema, cyanosis or pallor.  Abdomen:  Soft, nondistended,mild right lower quadrant abdominal tenderness  No rebound or guarding. Normal bowel sounds. No appreciable masses or hepatomegaly. Rectal:  Not performed.  Msk:  Symmetrical without gross deformities. Peripheral pulses intact.  Extremities:  Without edema, no deformity or joint abnormality. Normal ROM. Neurologic:  Alert and  oriented x4;  grossly normal neurologically.  Skin:   Dry and intact without significant lesions or rashes. Psychiatric: Oriented to person, place and time. Demonstrates good judgement and reason without abnormal affect or behaviors.  RECENT LABS AND IMAGING: CBC    Component Value Date/Time   WBC 4.4 (A) 05/22/2016 0928   WBC 6.8 03/26/2016 1903   RBC 4.98 05/22/2016 0928   RBC 4.92 03/26/2016 1903   HGB 15.0 05/22/2016 0928   HGB 14.7 03/26/2016 1903   HCT 42.1 (A) 05/22/2016 0928   HCT 39.8 03/26/2016 1903   PLT 250 03/26/2016 1903   MCV 84.6 05/22/2016 0928   MCH 30.1 05/22/2016 0928   MCH 29.9 03/26/2016 1903   MCHC 35.6 (A) 05/22/2016 0928   MCHC 36.9 (H) 03/26/2016 1903   RDW 13.7 03/26/2016 1903    CMP     Component Value Date/Time   NA 138 05/22/2016 0910   K 4.2 05/22/2016 0910   CL 105 05/22/2016 0910   CO2 25 05/22/2016 0910   GLUCOSE 95 05/22/2016 0910   BUN 12 05/22/2016 0910   CREATININE 1.03 05/22/2016 0910   CALCIUM 9.8 05/22/2016 0910   PROT 7.6 05/22/2016 0910   ALBUMIN 4.4 05/22/2016 0910   AST 20 05/22/2016 0910   ALT 12 05/22/2016 0910   ALKPHOS 85 05/22/2016 0910   BILITOT 1.4 (H) 05/22/2016 0910   GFRNONAA >89 05/22/2016 0910   GFRAA >89 05/22/2016 0910   05/22/16 H.Pylori Breath Test: neg             Lipase: 19-normal  Assessment: 1. Epigastric Pain:Better on Omeprazole 20 mg twice a day, lipase normal, H. pylori breath test negative; likely represents gastritis/ reflux  2. GERD: See  above 3. Nausea:With above 4. Constipation: Patient has been on Linzess 145 mcg daily over the past year which relieves his symptoms  Plan: 1. Refilled patient's Linzess 145 mcg daily, one tab PO  30-60 minutes before eating #90 with 3 refills 2. Prescribed Omeprazole 20 mg twice a day, 30-60 minutes before eating breakfast and dinner #90 with 1 refill 3. Reviewed antireflux diet and lifestyle modifications. Handout provided. 4. Discussed with the patient that he should return to clinic in 6 months for follow-up with myself or Dr. Lavon PaganiniNandigam. If he continues with symptoms at that time, can consider EGD for further evaluation versus continuation of Omeprazole   Bryan MeekerJennifer Batina Dougan, PA-C [facility] 07/29/2016, 10:44 AM  Cc: Magdalene RiverWiseman, Brittany D, PA*

## 2016-07-29 NOTE — Patient Instructions (Signed)
[facility].  1. Linzess 145 ,mg 2. Omeprazole 20 mg twice daily  We have provided a reflux handout.   Follow up with Hyacinth MeekerJennifer Lemmon or Dr. Lavon PaganiniNandigam in 6 months.

## 2016-07-30 NOTE — Progress Notes (Signed)
Reviewed and agree with documentation and assessment and plan. K. Veena Nandigam , MD   

## 2016-11-17 ENCOUNTER — Ambulatory Visit (INDEPENDENT_AMBULATORY_CARE_PROVIDER_SITE_OTHER): Payer: No Typology Code available for payment source | Admitting: Physician Assistant

## 2016-11-17 VITALS — BP 132/90 | HR 62 | Temp 97.6°F | Resp 18 | Ht 68.0 in | Wt 204.2 lb

## 2016-11-17 DIAGNOSIS — Z13228 Encounter for screening for other metabolic disorders: Secondary | ICD-10-CM

## 2016-11-17 DIAGNOSIS — Z13220 Encounter for screening for lipoid disorders: Secondary | ICD-10-CM

## 2016-11-17 DIAGNOSIS — Z113 Encounter for screening for infections with a predominantly sexual mode of transmission: Secondary | ICD-10-CM | POA: Diagnosis not present

## 2016-11-17 DIAGNOSIS — Z130 Encounter for screening for diseases of the blood and blood-forming organs and certain disorders involving the immune mechanism: Secondary | ICD-10-CM | POA: Diagnosis not present

## 2016-11-17 DIAGNOSIS — K5900 Constipation, unspecified: Secondary | ICD-10-CM | POA: Diagnosis not present

## 2016-11-17 DIAGNOSIS — Z1329 Encounter for screening for other suspected endocrine disorder: Secondary | ICD-10-CM

## 2016-11-17 DIAGNOSIS — Z0000 Encounter for general adult medical examination without abnormal findings: Secondary | ICD-10-CM

## 2016-11-17 DIAGNOSIS — K219 Gastro-esophageal reflux disease without esophagitis: Secondary | ICD-10-CM | POA: Diagnosis not present

## 2016-11-17 MED ORDER — LINZESS 145 MCG PO CAPS: 145.0000 ug | ORAL_CAPSULE | Freq: Every day | ORAL | 3 refills | Status: AC | PRN

## 2016-11-17 MED ORDER — OMEPRAZOLE 20 MG PO CPDR
DELAYED_RELEASE_CAPSULE | ORAL | 1 refills | Status: AC
Start: 2016-11-17 — End: ?

## 2016-11-17 NOTE — Patient Instructions (Addendum)
I will contact you in a few days with your lab results.   Keep up the good work. Continue working on improving your diet. The more greens and the less fast foods, the better!  Also, work on getting a little more sleep at night. 8 hours would be best!  Continue monitoring your bp outside of the office over the next few weeks. If you are consistently in the 130s, please let me know. Also ask your family if they have high bp. Your goal is <130/90.   If you are still having any abdominal discomfort in May, please go back to GI as you may need an endoscopy at that time.   Thank you for letting me participate in your health and well being.    IF you received an x-ray today, you will receive an invoice from [facility]. Please contact [facility] at [phone] with questions or concerns regarding your invoice.   IF you received labwork today, you will receive an invoice from [facility]. Please contact [facility] at [phone] with questions or concerns regarding your invoice.   Our billing staff will not be able to assist you with questions regarding bills from these companies.  You will be contacted with the lab results as soon as they are available. The fastest way to get your results is to activate your My Chart account. Instructions are located on the last page of this paperwork. If you have not heard from us regarding the results in 2 weeks, please contact this office.

## 2016-11-17 NOTE — Progress Notes (Signed)
Bryan Williamson  MRN: [id number] DOB: [birth year]  Subjective:  Pt is a 26 y.o. male who presents for annual physical exam. Pt is fasting today.   Social: Plays football at [employer]. 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 Very busy. He is sexually active with women. His last STD testing was 3 months ago, has only had one partner since.   Diet: Notes it is a lot better. He eats rice, vegetables, baked/fried chicken. Cutting down on fast food. Drinks mostly water and juice.   Exercise: Exercises daily for football.   Sleep: Sleeps about 6 hours each night.   Last dental exam: 09/207  Last vision exam: 2016   Vaccinations      Tetanus: 2017       Chronic issues: Constipation: Takes linzess prn for constipation. States his constipation has actually improved significantly with dietary changes. He is now having 1-2 bowel movements a day.  GERD/epigastric pain: He has been seen by me for this in the past. After testing and PPI trial, he was referred to GI. GI continued the PPI and told him to return in 6 months if he is still having pain. Notes his GI visit was in 07/2016. He is taking PPI twice daily and notes his epigastric pain only presents every now and then. Much improved since he was first evaluated by me in 05/2016.   There are no active problems to display for this patient.   No current outpatient prescriptions on file prior to visit.   No current facility-administered medications on file prior to visit.     No Known Allergies  Social History   Social History  . Marital status: Single    Spouse name: N/A  . Number of children: N/A  . Years of education: N/A   Social History Main Topics  . Smoking status: Never Smoker  . Smokeless tobacco: Never Used  . Alcohol use No  . Drug use: No  . Sexual activity: Yes    Birth control/ protection: Condom   Other Topics Concern  . None   Social History Narrative  . None    No past surgical history on  file.  Family History  Problem Relation Age of Onset  . Diabetes Maternal Grandmother     Review of Systems  Objective:  BP 132/90 (BP Location: Right Arm, Patient Position: Sitting, Cuff Size: Small)   Pulse 62   Temp 97.6 F (36.4 C) (Oral)   Resp 18   Ht _0  (1.727 m)   Wt 204 lb 3.2 oz (92.6 kg)   SpO2 96%   BMI 31.05 kg/m   Physical Exam  Constitutional: He is oriented to person, place, and time and well-developed, well-nourished, and in no distress.  HENT:  Head: Normocephalic and atraumatic.  Right Ear: Hearing, tympanic membrane, external ear and ear canal normal.  Left Ear: Hearing, tympanic membrane, external ear and ear canal normal.  Nose: Nose normal.  Mouth/Throat: Uvula is midline, oropharynx is clear and moist and mucous membranes are normal. No oropharyngeal exudate.  Eyes: Conjunctivae and EOM are normal. Pupils are equal, round, and reactive to light.  Neck: Trachea normal and normal range of motion.  Cardiovascular: Normal rate, regular rhythm, normal heart sounds and intact distal pulses.   Pulmonary/Chest: Effort normal and breath sounds normal.  Abdominal: Soft. Normal appearance and bowel sounds are normal. There is no tenderness.  Musculoskeletal: Normal range of motion.  Lymphadenopathy:  Head (right side): No submental, no submandibular, no tonsillar, no preauricular, no posterior auricular and no occipital adenopathy present.       Head (left side): No submental, no submandibular, no tonsillar, no preauricular, no posterior auricular and no occipital adenopathy present.    He has cervical adenopathy.       Right cervical: Posterior cervical adenopathy present.       Right: No supraclavicular adenopathy present.       Left: No supraclavicular adenopathy present.  Neurological: He is alert and oriented to person, place, and time. He has normal sensation, normal strength and normal reflexes. Gait normal.  Skin: Skin is warm and dry.    Psychiatric: Affect normal.  Vitals reviewed.   Visual Acuity Screening   Right eye Left eye Both eyes  Without correction: 20/15-2 20/15-2 20/15-1  With correction:      BP Readings from Last 3 Encounters:  11/17/16 132/90  07/29/16 114/80  07/09/16 110/70    Assessment and Plan :  Discussed healthy lifestyle, diet, exercise, preventative care, vaccinations, and addressed patient's concerns. Plan for follow up in one year. Otherwise, plan for specific conditions below.  1. Annual physical exam Await lab results  2. Gastroesophageal reflux disease, esophagitis presence not specified Informed that it is important to follow up with GI in 2 months if still having any epigastric pain.  - omeprazole (PRILOSEC) 20 MG capsule; Take 1 tab twice daily 30-60 min before eating.  Dispense: 60 capsule; Refill: 1  3. Constipation, unspecified constipation type - LINZESS 145 MCG CAPS capsule; Take 1 capsule (145 mcg total) by mouth daily as needed (constipation).  Dispense: 90 capsule; Refill: 3  4. Screening for metabolic disorder - [id number]  5. Screen for STD (sexually transmitted disease) - GC/Chlamydia Probe Amp - Trichomonas vaginalis, RNA - HIV antibody - RPR  6. Screening, lipid - Lipid panel  7. Screening for thyroid disorder - TSH  8. Screening, anemia, deficiency, iron - CBC with Differential/Platelet  Tenna Delaine PA-C  [facility] 11/17/2016 2:17 PM

## 2016-11-18 LAB — CMP14+EGFR
ALT: 19 IU/L (ref 0–44)
AST: 16 IU/L (ref 0–40)
Albumin/Globulin Ratio: 1.5 (ref 1.2–2.2)
Albumin: 4.6 g/dL (ref 3.5–5.5)
Alkaline Phosphatase: 128 IU/L — ABNORMAL HIGH (ref 39–117)
BUN/Creatinine Ratio: 12 (ref 9–20)
BUN: 12 mg/dL (ref 6–20)
Bilirubin Total: 0.8 mg/dL (ref 0.0–1.2)
CALCIUM: 10 mg/dL (ref 8.7–10.2)
CO2: 22 mmol/L (ref 18–29)
CREATININE: 0.97 mg/dL (ref 0.76–1.27)
Chloride: 99 mmol/L (ref 96–106)
GFR calc Af Amer: 124 mL/min/{1.73_m2} (ref >59)
GFR, EST NON AFRICAN AMERICAN: 107 mL/min/{1.73_m2} (ref >59)
GLOBULIN, TOTAL: 3.1 g/dL (ref 1.5–4.5)
Glucose: 99 mg/dL (ref 65–99)
Potassium: 4.1 mmol/L (ref 3.5–5.2)
Sodium: 141 mmol/L (ref 134–144)
Total Protein: 7.7 g/dL (ref 6.0–8.5)

## 2016-11-18 LAB — CBC WITH DIFFERENTIAL/PLATELET
Basophils Absolute: 0 10*3/uL (ref 0.0–0.2)
Basos: 0 %
EOS (ABSOLUTE): 0.2 10*3/uL (ref 0.0–0.4)
Eos: 3 %
Hematocrit: 42.6 % (ref 37.5–51.0)
Hemoglobin: 15.4 g/dL (ref 13.0–17.7)
IMMATURE GRANS (ABS): 0 10*3/uL (ref 0.0–0.1)
IMMATURE GRANULOCYTES: 0 %
LYMPHS: 50 %
Lymphocytes Absolute: 3.1 10*3/uL (ref 0.7–3.1)
MCH: 29.7 pg (ref 26.6–33.0)
MCHC: 36.2 g/dL — ABNORMAL HIGH (ref 31.5–35.7)
MCV: 82 fL (ref 79–97)
MONOS ABS: 0.4 10*3/uL (ref 0.1–0.9)
Monocytes: 7 %
NEUTROS PCT: 40 %
Neutrophils Absolute: 2.6 10*3/uL (ref 1.4–7.0)
Platelets: 279 10*3/uL (ref 150–379)
RBC: 5.18 x10E6/uL (ref 4.14–5.80)
RDW: 13.6 % (ref 12.3–15.4)
WBC: 6.3 10*3/uL (ref 3.4–10.8)

## 2016-11-18 LAB — TSH: TSH: 1.53 u[IU]/mL (ref 0.450–4.500)

## 2016-11-18 LAB — LIPID PANEL
Chol/HDL Ratio: 4.4 ratio units (ref 0.0–5.0)
Cholesterol, Total: 170 mg/dL (ref 100–199)
HDL: 39 mg/dL — AB (ref >39)
LDL Calculated: 112 mg/dL — ABNORMAL HIGH (ref 0–99)
Triglycerides: 94 mg/dL (ref 0–149)
VLDL CHOLESTEROL CAL: 19 mg/dL (ref 5–40)

## 2016-11-18 LAB — HIV ANTIBODY (ROUTINE TESTING W REFLEX): HIV Screen 4th Generation wRfx: NONREACTIVE

## 2016-11-18 LAB — RPR: RPR Ser Ql: NONREACTIVE

## 2016-11-18 LAB — GC/CHLAMYDIA PROBE AMP
Chlamydia trachomatis, NAA: NEGATIVE
NEISSERIA GONORRHOEAE BY PCR: NEGATIVE

## 2016-11-20 LAB — TRICHOMONAS VAGINALIS, PROBE AMP: TRICH VAG BY NAA: NEGATIVE

## 2016-11-27 ENCOUNTER — Telehealth: Payer: Self-pay | Admitting: Emergency Medicine

## 2016-11-27 NOTE — Telephone Encounter (Signed)
-----  Message from Brittany D Wiseman, PA-C sent at 11/23/2016  1:27 PM EDT ----- Please call patient and report STI screen was negative for HIV, syphilis, trichomonas, gonorrhea and chlamydia. He also had normal blood count, electrolytes, blood sugar, thyroid function, and kidney function.His alk phos was slightly elevated, which could be due to multiple reasons. Please ask him if he is having right upper quadrant pain at all. If he is, please let me know. If not, we can wait and repeat this lab in 3 months. Also his bad cholesterol (LDL) was slightly elevated at 112 (normal is <100). This can be lowered with strict dietary control. Try to lower the amount of saturated fats in your diet, which are found in red meat, butter, cheese, and coconut oil.     

## 2017-02-18 IMAGING — DX DG FINGER LITTLE 2+V*L*
3 series · 3 of 3 positions shown · non-contrast
Comparison: None.

CLINICAL DATA: Hand injury, initial encounter, fifth finger injury
at football practice

EXAM:
LEFT LITTLE FINGER 2+V

[finger ap]
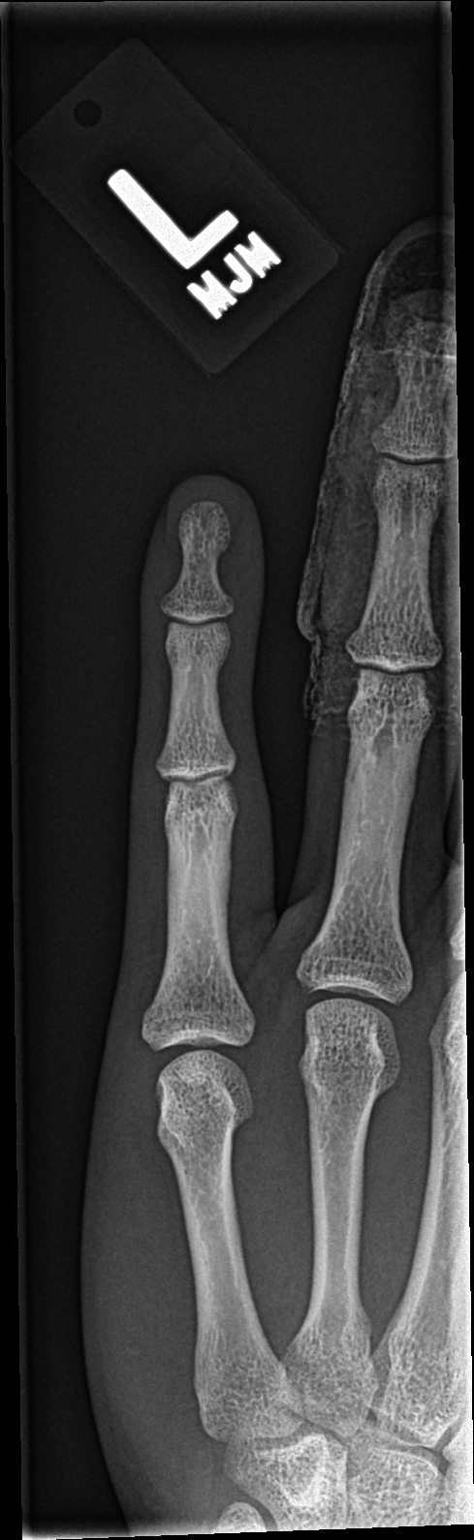

[finger obl]
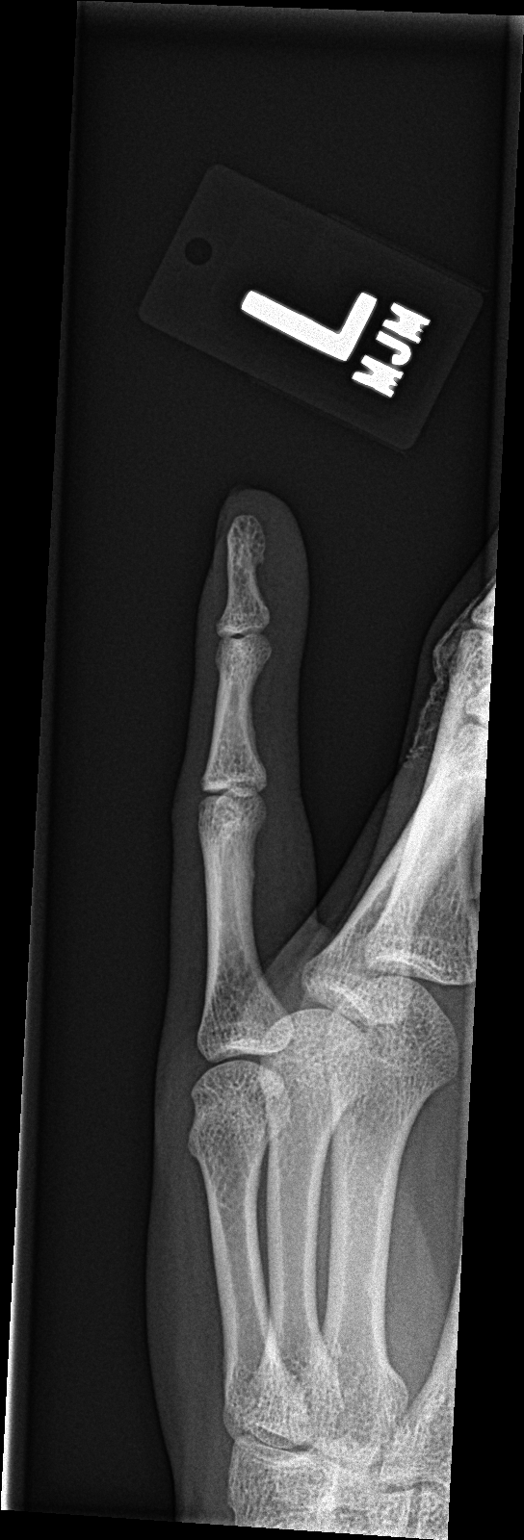

[finger lat]
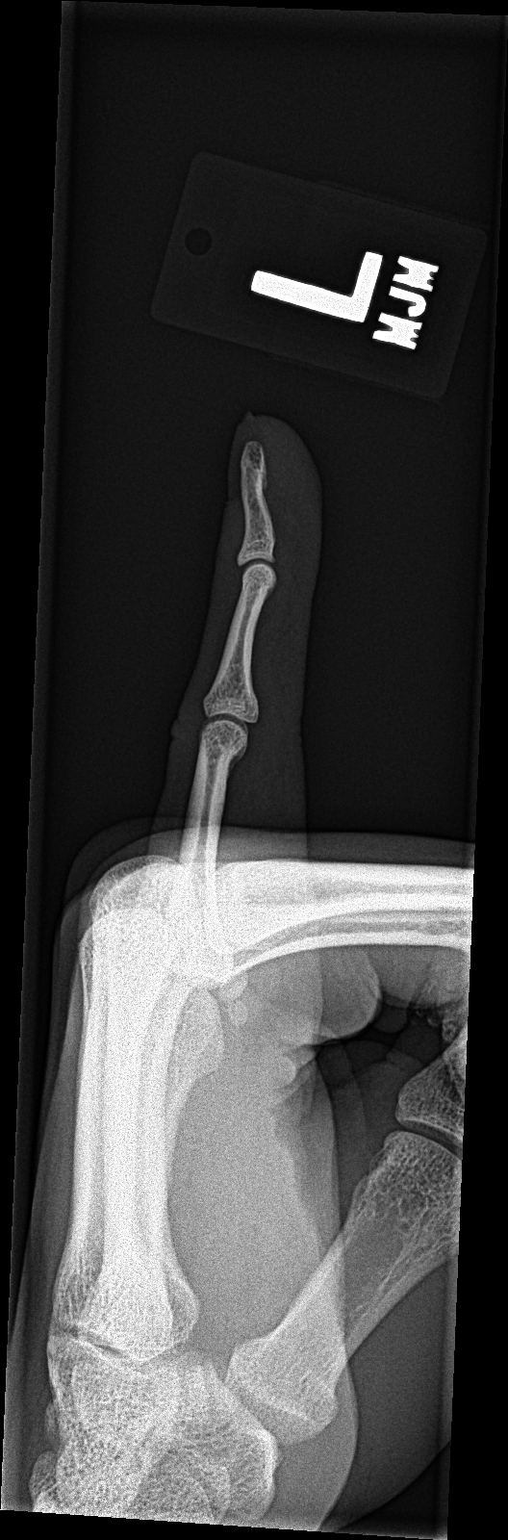

[3 of 3 positions shown; findings below may reference images not displayed]

FINDINGS: Three views of the left fifth finger submitted. No acute fracture or
subluxation.
IMPRESSION: Negative.

## 2017-03-27 ENCOUNTER — Other Ambulatory Visit: Payer: Self-pay | Admitting: Family Medicine

## 2017-03-27 ENCOUNTER — Other Ambulatory Visit (HOSPITAL_COMMUNITY)
Admission: RE | Admit: 2017-03-27 | Discharge: 2017-03-27 | Disposition: A | Discharge status: Home / Self Care | Payer: 59 | Source: Ambulatory Visit | Attending: Family Medicine | Admitting: Family Medicine

## 2017-03-27 DIAGNOSIS — Z113 Encounter for screening for infections with a predominantly sexual mode of transmission: Secondary | ICD-10-CM | POA: Insufficient documentation

## 2017-03-31 LAB — URINE CYTOLOGY ANCILLARY ONLY
Bacterial vaginitis: NEGATIVE
CANDIDA VAGINITIS: NEGATIVE
CHLAMYDIA, DNA PROBE: NEGATIVE
NEISSERIA GONORRHEA: NEGATIVE
Trichomonas: NEGATIVE

## 2018-02-01 ENCOUNTER — Emergency Department (HOSPITAL_COMMUNITY)
Admission: EM | Admit: 2018-02-01 | Discharge: 2018-02-01 | Disposition: A | Discharge status: Home / Self Care | Payer: PRIVATE HEALTH INSURANCE | Attending: Emergency Medicine | Admitting: Emergency Medicine

## 2018-02-01 ENCOUNTER — Encounter (HOSPITAL_COMMUNITY): Payer: Self-pay | Admitting: Emergency Medicine

## 2018-02-01 DIAGNOSIS — R070 Pain in throat: Secondary | ICD-10-CM | POA: Diagnosis present

## 2018-02-01 DIAGNOSIS — J029 Acute pharyngitis, unspecified: Secondary | ICD-10-CM | POA: Diagnosis not present

## 2018-02-01 DIAGNOSIS — Z79899 Other long term (current) drug therapy: Secondary | ICD-10-CM | POA: Diagnosis not present

## 2018-02-01 LAB — GROUP A STREP BY PCR: Group A Strep by PCR: NOT DETECTED

## 2018-02-01 NOTE — ED Triage Notes (Signed)
Patient here from home, reports sinus pressure since Sunday. Sore throat increased on left side.

## 2018-02-01 NOTE — ED Provider Notes (Signed)
[facility]-EMERGENCY DEPT Provider Note   CSN: [id number] Arrival date & time: 02/01/18  1026     History   Chief Complaint Chief Complaint  Patient presents with  . Sore Throat  . Sinus Problem    HPI Bryan Williamson is a 27 y.o. male presenting to the ED with 2 days of sore throat and nasal congestion.  Patient states symptoms began on Sunday, with sore throat, worse on the left side.  Pain is made worse with swallowing.  He also notes nasal congestion and mild ear pain.  Reports subjective fever last night, for which he took Alka-Seltzer cold medication.  Denies cough, difficulty breathing or swallowing, or other complaints.  The history is provided by the patient.    History reviewed. No pertinent past medical history.  There are no active problems to display for this patient.   History reviewed. No pertinent surgical history.      Home Medications    Prior to Admission medications   Medication Sig Start Date End Date Taking? Authorizing Provider  LINZESS 145 MCG CAPS capsule Take 1 capsule (145 mcg total) by mouth daily as needed (constipation). 11/17/16   Benjiman Core D, PA-C  omeprazole (PRILOSEC) 20 MG capsule Take 1 tab twice daily 30-60 min before eating. 11/17/16   Magdalene River, PA-C    Family History Family History  Problem Relation Age of Onset  . Diabetes Maternal Grandmother     Social History Social History   Tobacco Use  . Smoking status: Never Smoker  . Smokeless tobacco: Never Used  Substance Use Topics  . Alcohol use: No  . Drug use: No     Allergies   Patient has no known allergies.   Review of Systems Review of Systems  Constitutional: Positive for fever (Subjective).  HENT: Positive for congestion, ear pain and sore throat. Negative for trouble swallowing and voice change.   Respiratory: Negative for cough.      Physical Exam Updated Vital Signs BP 134/88 (BP Location: Right Arm)   Pulse 94    Temp 98 F (36.7 C) (Oral)   Resp 17   SpO2 99%   Physical Exam  Constitutional: He appears well-developed and well-nourished. He does not appear ill. No distress.  Well-appearing, tolerating secretions.  HENT:  Head: Normocephalic and atraumatic.  Right Ear: Tympanic membrane normal.  Left Ear: Tympanic membrane normal.  Nose: Nose normal.  Mouth/Throat: Uvula is midline. No trismus in the jaw. No uvula swelling. Posterior oropharyngeal erythema present. No oropharyngeal exudate.  Mild tonsillar edema bilaterally  Eyes: Conjunctivae are normal.  Neck: Normal range of motion. Neck supple.  Cardiovascular: Normal rate, regular rhythm and intact distal pulses.  Pulmonary/Chest: Effort normal and breath sounds normal. No stridor. No respiratory distress. He has no wheezes. He has no rales.  Lymphadenopathy:    He has cervical adenopathy (Mild left anterior cervical adenopathy).  Psychiatric: He has a normal mood and affect. His behavior is normal.  Nursing note and vitals reviewed.    ED Treatments / Results  Labs (all labs ordered are listed, but only abnormal results are displayed) Labs Reviewed  GROUP A STREP BY PCR    EKG None  Radiology No results found.  Procedures Procedures (including critical care time)  Medications Ordered in ED Medications - No data to display   Initial Impression / Assessment and Plan / ED Course  I have reviewed the triage vital signs and the nursing notes.  Pertinent labs &  imaging results that were available during my care of the patient were reviewed by me and considered in my medical decision making (see chart for details).     Pt afebrile without tonsillar exudate, negative strep. Presents with mild cervical lymphadenopathy, & dysphagia; diagnosis of viral pharyngitis. No abx indicated. DC w symptomatic tx for pain  Pt does not appear dehydrated, but did discuss importance of water rehydration. Presentation non concerning for PTA or  infxn spread to soft tissue. No trismus or uvula deviation. Specific return precautions discussed. Pt able to drink water in ED without difficulty with intact air way. Recommended PCP follow up.  Discussed results, findings, treatment and follow up. Patient advised of return precautions. Patient verbalized understanding and agreed with plan.  Final Clinical Impressions(s) / ED Diagnoses   Final diagnoses:  Viral pharyngitis    ED Discharge Orders    None       Blondie Riggsbee, [place] N, PA-C 02/01/18 1235    Rolland Porter, MD 02/01/18 1557

## 2018-02-01 NOTE — ED Notes (Signed)
Bed: WTR6 Expected date:  Expected time:  Means of arrival:  Comments: 

## 2018-02-01 NOTE — Discharge Instructions (Signed)
Please read instructions below.  You can take tylenol or ibuprofen as needed for sore throat or fever.  Drink plenty of water.  Use saline nasal spray for congestion. Follow up with your primary care provider as needed.  Return to the ER for inability to swallow liquids, difficulty breathing, or new or worsening symptoms.  

## 2018-03-05 ENCOUNTER — Emergency Department (HOSPITAL_COMMUNITY)
Admission: EM | Admit: 2018-03-05 | Discharge: 2018-03-05 | Disposition: A | Discharge status: Home / Self Care | Payer: PRIVATE HEALTH INSURANCE | Attending: Emergency Medicine | Admitting: Emergency Medicine

## 2018-03-05 ENCOUNTER — Encounter (HOSPITAL_COMMUNITY): Payer: Self-pay

## 2018-03-05 ENCOUNTER — Other Ambulatory Visit: Payer: Self-pay

## 2018-03-05 DIAGNOSIS — S0502XA Injury of conjunctiva and corneal abrasion without foreign body, left eye, initial encounter: Secondary | ICD-10-CM | POA: Insufficient documentation

## 2018-03-05 DIAGNOSIS — Y92310 Basketball court as the place of occurrence of the external cause: Secondary | ICD-10-CM | POA: Insufficient documentation

## 2018-03-05 DIAGNOSIS — W500XXA Accidental hit or strike by another person, initial encounter: Secondary | ICD-10-CM | POA: Insufficient documentation

## 2018-03-05 DIAGNOSIS — Y999 Unspecified external cause status: Secondary | ICD-10-CM | POA: Insufficient documentation

## 2018-03-05 DIAGNOSIS — Y9367 Activity, basketball: Secondary | ICD-10-CM | POA: Insufficient documentation

## 2018-03-05 MED ORDER — OFLOXACIN 0.3 % OP SOLN: 2.0000 [drp] | Freq: Four times a day (QID) | OPHTHALMIC | 0 refills | Status: AC

## 2018-03-05 MED ORDER — TETRACAINE HCL 0.5 % OP SOLN
2.0000 [drp] | Freq: Once | OPHTHALMIC | Status: AC
  Administered 2018-03-05: 2 [drp] via OPHTHALMIC
  Filled 2018-03-05: qty 4

## 2018-03-05 MED ORDER — FLUORESCEIN SODIUM 1 MG OP STRP
1.0000 | ORAL_STRIP | Freq: Once | OPHTHALMIC | Status: AC
  Administered 2018-03-05: 1 via OPHTHALMIC
  Filled 2018-03-05: qty 1

## 2018-03-05 NOTE — Discharge Instructions (Addendum)
Use eyedrops as prescribed (2 drops 4 times a day).  Use Tylenol or ibuprofen as needed for pain. Use ice or cool rag to help with pain and swelling. If you are having continued pain on Monday, you need to follow-up with eye doctor, whose information is listed below. Return to the emergency room immediately if you develop vision loss, pain behind your eye, difficulty moving your eye in all directions, bulging of your eye, or any new or concerning symptoms.
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
## 2018-03-05 NOTE — ED Provider Notes (Signed)
[facility]-EMERGENCY DEPT Provider Note   CSN: [id number] Arrival date & time: 03/05/18  1303     History   Chief Complaint Chief Complaint  Patient presents with  . Eye Injury    HPI Bryan Williamson is a 27 y.o. male presenting for evaluation of left eye pain.  Patient states he was playing basketball 2 days ago when he was hit in the left eye.  Since then, he has had pain of the left eye.  He states it feels like there is something in his eye.  He has associated photophobia.  No vision changes or loss.  No pain behind the eye or bulging.  He has used a cool rag on his eye without significant improvement of his pain.  He has not tried any Tylenol or ibuprofen.  He does not wear contacts or glasses.  No drainage from the eye.  He has no medical problems, takes no medications daily.  He reports mild pain of the left eye when light is in his right eye.  He denies pain elsewhere.  HPI  History reviewed. No pertinent past medical history.  There are no active problems to display for this patient.   History reviewed. No pertinent surgical history.      Home Medications    Prior to Admission medications   Medication Sig Start Date End Date Taking? Authorizing Provider  LINZESS 145 MCG CAPS capsule Take 1 capsule (145 mcg total) by mouth daily as needed (constipation). 11/17/16   Benjiman CoreWiseman, Brittany D, PA-C  ofloxacin (OCUFLOX) 0.3 % ophthalmic solution Place 2 drops into the left eye 4 (four) times daily for 5 days. 03/05/18 03/10/18  Gianni Fuchs, PA-C  omeprazole (PRILOSEC) 20 MG capsule Take 1 tab twice daily 30-60 min before eating. 11/17/16   Magdalene RiverWiseman, Brittany D, PA-C    Family History Family History  Problem Relation Age of Onset  . Diabetes Maternal Grandmother     Social History Social History   Tobacco Use  . Smoking status: Never Smoker  . Smokeless tobacco: Never Used  Substance Use Topics  . Alcohol use: No  . Drug use: No      Allergies   Patient has no known allergies.   Review of Systems Review of Systems  Constitutional: Negative for fever.  Eyes: Positive for photophobia, pain and redness. Negative for discharge, itching and visual disturbance.  Allergic/Immunologic: Negative for immunocompromised state.  Neurological: Negative for headaches.  Hematological: Does not bruise/bleed easily.     Physical Exam Updated Vital Signs BP 133/85 (BP Location: Left Arm)   Pulse 86   Temp 98.3 F (36.8 C) (Oral)   Resp 16   Ht 5\' 9"  (1.753 m)   Wt 93 kg (205 lb)   SpO2 99%   BMI 30.27 kg/m   Physical Exam  Constitutional: He is oriented to person, place, and time. He appears well-developed and well-nourished. No distress.  HENT:  Head: Normocephalic and atraumatic.  Eyes: Pupils are equal, round, and reactive to light. EOM are normal. Left eye exhibits no chemosis, no discharge and no exudate. Right conjunctiva is not injected. Right conjunctiva has no hemorrhage. Left conjunctiva is injected. Left conjunctiva has no hemorrhage.  Slit lamp exam:      The right eye shows fluorescein uptake.       The left eye shows corneal abrasion. The left eye shows no foreign body, no hyphema and no hypopyon.  Mild swelling of the left eyelid.  No proptosis.  Injected conjunctivae of the left eye. EOMI and PERRLA. visual acuity normal. Pain improved with tetracaine gtts. After drops, no photophobia (direct or consensual).  On fluorescein stain, patient with small corneal abrasion at the 6 o'clock position of the iris.  Negative Seidel sign. tonopen reassuring bilaterally, 17 on R, 19 on L.   Neck: Normal range of motion.  Pulmonary/Chest: Effort normal.  Abdominal: He exhibits no distension.  Musculoskeletal: Normal range of motion.  Neurological: He is alert and oriented to person, place, and time.  Skin: Skin is warm. No rash noted.  Psychiatric: He has a normal mood and affect.  Nursing note and vitals  reviewed.    ED Treatments / Results  Labs (all labs ordered are listed, but only abnormal results are displayed) Labs Reviewed - No data to display  EKG None  Radiology No results found.  Procedures Procedures (including critical care time)  Medications Ordered in ED Medications  tetracaine (PONTOCAINE) 0.5 % ophthalmic solution 2 drop (2 drops Left Eye Given 03/05/18 1408)  fluorescein ophthalmic strip 1 strip (1 strip Left Eye Given 03/05/18 1337)     Initial Impression / Assessment and Plan / ED Course  I have reviewed the triage vital signs and the nursing notes.  Pertinent labs & imaging results that were available during my care of the patient were reviewed by me and considered in my medical decision making (see chart for details).     Patient presented for evaluation of left eye pain and redness.  Physical exam reassuring, visual acuity equal bilaterally.  Tono-Pen equal.  Fluorescein stain shows small corneal abrasion.  Pain resolved with tetracaine.  No bulging, EOMI.  Doubt entrapment or orbital fracture. ?mild traumatic iritis due to photophobia. Will tx with abx gtts, tylenol/ibuprofen for pain. F/u with ophthalmology on Monday for further evaluation. Strict return precautions given. At this time, pt appears safe for d/c. Pt states he understands ans agrees to plan.  Final Clinical Impressions(s) / ED Diagnoses   Final diagnoses:  Abrasion of left cornea, initial encounter    ED Discharge Orders        Ordered    ofloxacin (OCUFLOX) 0.3 % ophthalmic solution  4 times daily     03/05/18 1351       Virgilene Stryker, PA-C 03/05/18 1413    Jacalyn Lefevre, MD 03/05/18 1514

## 2019-11-09 ENCOUNTER — Ambulatory Visit: Payer: Self-pay | Attending: Internal Medicine

## 2019-11-09 DIAGNOSIS — Z23 Encounter for immunization: Secondary | ICD-10-CM | POA: Insufficient documentation

## 2019-11-09 NOTE — Progress Notes (Signed)
   Covid-19 Vaccination Clinic  Name:  Bryan Williamson    MRN: [id number] DOB: 03-08-1991  11/09/2019  Mr. Goeller was observed post Covid-19 immunization for 15 minutes without incidence. He was provided with Vaccine Information Sheet and instruction to access the V-Safe system.   Mr. Lampe was instructed to call 911 with any severe reactions post vaccine: Marland Kitchen Difficulty breathing  . Swelling of your face and throat  . A fast heartbeat  . A bad rash all over your body  . Dizziness and weakness    Immunizations Administered    Name Date Dose VIS Date Route   Pfizer COVID-19 Vaccine 11/09/2019 12:46 PM 0.3 mL 08/25/2019 Intramuscular   Manufacturer: [employer], [employer]   Lot: [id number]   NDC: [address]-1

## 2019-12-05 ENCOUNTER — Ambulatory Visit: Payer: Self-pay | Attending: Internal Medicine

## 2019-12-05 DIAGNOSIS — Z23 Encounter for immunization: Secondary | ICD-10-CM

## 2019-12-05 NOTE — Progress Notes (Signed)
   Covid-19 Vaccination Clinic  Name:  Bryan Williamson    MRN: [id number] DOB: 1990-12-03  12/05/2019  [name] was observed post Covid-19 immunization for 15 minutes without incident. He was provided with Vaccine Information Sheet and instruction to access the V-Safe system.   [name] was instructed to call 911 with any severe reactions post vaccine: Marland Kitchen Difficulty breathing  . Swelling of face and throat  . A fast heartbeat  . A bad rash all over body  . Dizziness and weakness   Immunizations Administered    Name Date Dose VIS Date Route   Pfizer COVID-19 Vaccine 12/05/2019 12:36 PM 0.3 mL 08/25/2019 Intramuscular   Manufacturer: [employer], [employer]   Lot: [id number]   NDC: [address]-6
